# Patient Record
Sex: Male | Born: 1965
Health system: Southern US, Community
[De-identification: ages and names within clinical notes are randomized; demographics above are authoritative.]

## PROBLEM LIST (undated history)

## (undated) DIAGNOSIS — R7611 Nonspecific reaction to tuberculin skin test without active tuberculosis: Secondary | ICD-10-CM

## (undated) DIAGNOSIS — K859 Acute pancreatitis without necrosis or infection, unspecified: Secondary | ICD-10-CM

## (undated) DIAGNOSIS — K219 Gastro-esophageal reflux disease without esophagitis: Secondary | ICD-10-CM

## (undated) DIAGNOSIS — J189 Pneumonia, unspecified organism: Secondary | ICD-10-CM

## (undated) DIAGNOSIS — B3781 Candidal esophagitis: Secondary | ICD-10-CM

## (undated) HISTORY — DX: Acute pancreatitis without necrosis or infection, unspecified: K85.90

## (undated) HISTORY — DX: Gastro-esophageal reflux disease without esophagitis: K21.9

## (undated) HISTORY — PX: ESOPHAGOGASTRODUODENOSCOPY: SHX1529

## (undated) HISTORY — PX: TONSILLECTOMY: SUR1361

## (undated) HISTORY — DX: Pneumonia, unspecified organism: J18.9

## (undated) HISTORY — DX: Nonspecific reaction to tuberculin skin test without active tuberculosis: R76.11

## (undated) HISTORY — DX: Candidal esophagitis: B37.81

## (undated) HISTORY — PX: HERNIA REPAIR: SHX51

---

## 2005-09-23 ENCOUNTER — Ambulatory Visit: Payer: Self-pay | Admitting: Internal Medicine

## 2005-09-23 ENCOUNTER — Ambulatory Visit: Payer: Self-pay | Admitting: Cardiovascular Disease

## 2006-12-30 ENCOUNTER — Ambulatory Visit (HOSPITAL_COMMUNITY): Admission: RE | Admit: 2006-12-30 | Discharge: 2006-12-30 | Payer: Self-pay | Admitting: Internal Medicine

## 2008-02-22 ENCOUNTER — Ambulatory Visit (HOSPITAL_COMMUNITY): Admission: RE | Admit: 2008-02-22 | Discharge: 2008-02-22 | Payer: Self-pay | Admitting: Internal Medicine

## 2008-03-04 ENCOUNTER — Telehealth: Payer: Self-pay | Admitting: Internal Medicine

## 2008-03-12 ENCOUNTER — Encounter: Payer: Self-pay | Admitting: Internal Medicine

## 2008-03-12 ENCOUNTER — Ambulatory Visit: Payer: Self-pay | Admitting: Internal Medicine

## 2008-03-19 ENCOUNTER — Encounter: Payer: Self-pay | Admitting: Internal Medicine

## 2008-03-19 DIAGNOSIS — B3781 Candidal esophagitis: Secondary | ICD-10-CM | POA: Insufficient documentation

## 2008-03-19 DIAGNOSIS — K219 Gastro-esophageal reflux disease without esophagitis: Secondary | ICD-10-CM

## 2008-08-17 ENCOUNTER — Ambulatory Visit (HOSPITAL_COMMUNITY): Admission: RE | Admit: 2008-08-17 | Discharge: 2008-08-17 | Payer: Self-pay | Admitting: Physician Assistant

## 2008-08-18 ENCOUNTER — Telehealth: Payer: Self-pay | Admitting: Internal Medicine

## 2008-08-18 ENCOUNTER — Ambulatory Visit: Payer: Self-pay | Admitting: Infectious Diseases

## 2008-08-18 ENCOUNTER — Encounter: Payer: Self-pay | Admitting: Internal Medicine

## 2008-08-18 DIAGNOSIS — Z8709 Personal history of other diseases of the respiratory system: Secondary | ICD-10-CM | POA: Insufficient documentation

## 2008-08-18 DIAGNOSIS — Z87448 Personal history of other diseases of urinary system: Secondary | ICD-10-CM

## 2008-08-18 DIAGNOSIS — Z8719 Personal history of other diseases of the digestive system: Secondary | ICD-10-CM | POA: Insufficient documentation

## 2008-08-18 DIAGNOSIS — R21 Rash and other nonspecific skin eruption: Secondary | ICD-10-CM

## 2008-08-18 LAB — CONVERTED CEMR LAB
AST: 20 units/L (ref 0–37)
BUN: 19 mg/dL (ref 6–23)
Basophils Relative: 1 % (ref 0–1)
Calcium: 9.7 mg/dL (ref 8.4–10.5)
Chloride: 105 meq/L (ref 96–112)
Creatinine, Ser: 0.96 mg/dL (ref 0.40–1.50)
Eosinophils Absolute: 0.1 10*3/uL (ref 0.0–0.7)
Eosinophils Relative: 2 % (ref 0–5)
HCT: 38.1 % — ABNORMAL LOW (ref 39.0–52.0)
Hemoglobin: 12.9 g/dL — ABNORMAL LOW (ref 13.0–17.0)
MCHC: 33.9 g/dL (ref 30.0–36.0)
MCV: 87.8 fL (ref 78.0–100.0)
Monocytes Absolute: 0.5 10*3/uL (ref 0.1–1.0)
Monocytes Relative: 8 % (ref 3–12)
Neutro Abs: 3.9 10*3/uL (ref 1.7–7.7)
Nitrite: NEGATIVE
RBC: 4.34 M/uL (ref 4.22–5.81)
Specific Gravity, Urine: 1.038 — ABNORMAL HIGH (ref 1.005–1.03)
Total Bilirubin: 0.4 mg/dL (ref 0.3–1.2)
pH: 5.5 (ref 5.0–8.0)

## 2008-08-19 ENCOUNTER — Ambulatory Visit: Payer: Self-pay | Admitting: Internal Medicine

## 2009-02-25 ENCOUNTER — Telehealth: Payer: Self-pay | Admitting: Cardiovascular Disease

## 2009-02-25 ENCOUNTER — Ambulatory Visit: Payer: Self-pay | Admitting: Cardiovascular Disease

## 2009-06-22 ENCOUNTER — Encounter: Payer: Self-pay | Admitting: Internal Medicine

## 2009-06-23 ENCOUNTER — Telehealth: Payer: Self-pay | Admitting: Internal Medicine

## 2009-06-25 ENCOUNTER — Ambulatory Visit: Payer: Self-pay | Admitting: Internal Medicine

## 2009-08-20 ENCOUNTER — Telehealth (INDEPENDENT_AMBULATORY_CARE_PROVIDER_SITE_OTHER): Payer: Self-pay | Admitting: *Deleted

## 2009-11-04 ENCOUNTER — Ambulatory Visit: Payer: Self-pay | Admitting: Cardiovascular Disease

## 2010-08-10 NOTE — Progress Notes (Signed)
Summary: Refill Dexilant  Phone Note Call from Patient   Summary of Call: per email communication pt request refills of Dexilant and 90 day rx.  Return email sent to pt advising refills sent to [facility] in December and requesting name of mail order pharmacy.  Awaiting response from pt. Initial call taken by: Francee Piccolo CMA Duncan Dull),  August 20, 2009 10:47 AM  Follow-up for Phone Call        return email from pt..mail 90 day rx to home address.  Discount card included. Follow-up by: Francee Piccolo CMA Duncan Dull),  August 21, 2009 8:55 AM    Prescriptions: DEXILANT 60 MG CPDR (DEXLANSOPRAZOLE) 1 by mouth 30 minutes prior to the first meal of the day  #90 x 3   Entered by:   Francee Piccolo CMA (AAMA)   Authorized by:   Iva Boop MD, [facility]   Signed by:   Francee Piccolo CMA (AAMA) on 08/21/2009   Method used:   Print then Mail to Patient   RxID:   [id number]

## 2010-09-26 ENCOUNTER — Telehealth: Payer: Self-pay | Admitting: Internal Medicine

## 2010-10-07 NOTE — Progress Notes (Signed)
Summary: Rx refill through [facility]  Phone Note Other Incoming   Summary of Call: Patient emailed re: needing new Dexilant Rx with [facility] mail order    Prescriptions: DEXILANT 60 MG CPDR (DEXLANSOPRAZOLE) 1 by mouth 30 minutes prior to the first meal of the day Brand medically necessary #90 x 3   Entered and Authorized by:   Iva Boop MD, [facility]   Signed by:   Iva Boop MD, FACG on 09/26/2010   Method used:   Faxed to ...       [facility]--mail order pharmacy (mail-order)             , [place]         Ph: [phone]       Fax: [phone]   RxID:   [id number]

## 2011-01-11 ENCOUNTER — Other Ambulatory Visit: Payer: Self-pay | Admitting: Cardiovascular Disease

## 2011-01-11 ENCOUNTER — Other Ambulatory Visit: Payer: Self-pay | Admitting: *Deleted

## 2011-01-11 DIAGNOSIS — A159 Respiratory tuberculosis unspecified: Secondary | ICD-10-CM

## 2011-02-28 ENCOUNTER — Telehealth: Payer: Self-pay | Admitting: *Deleted

## 2011-02-28 ENCOUNTER — Other Ambulatory Visit (INDEPENDENT_AMBULATORY_CARE_PROVIDER_SITE_OTHER): Payer: Self-pay | Admitting: *Deleted

## 2011-02-28 DIAGNOSIS — D649 Anemia, unspecified: Secondary | ICD-10-CM

## 2011-02-28 LAB — FERRITIN: Ferritin: 42.2 ng/mL (ref 22.0–322.0)

## 2011-02-28 LAB — CBC WITH DIFFERENTIAL/PLATELET
Eosinophils Absolute: 0 10*3/uL (ref 0.0–0.7)
Eosinophils Relative: 1.1 % (ref 0.0–5.0)
Lymphocytes Relative: 35.8 % (ref 12.0–46.0)
MCHC: 33.6 g/dL (ref 30.0–36.0)
MCV: 91.3 fl (ref 78.0–100.0)
Monocytes Absolute: 0.3 10*3/uL (ref 0.1–1.0)
Neutrophils Relative %: 55.5 % (ref 43.0–77.0)
Platelets: 167 10*3/uL (ref 150.0–400.0)
WBC: 4.4 10*3/uL — ABNORMAL LOW (ref 4.5–10.5)

## 2011-02-28 LAB — IRON: Iron: 77 ug/dL (ref 42–165)

## 2011-02-28 NOTE — Telephone Encounter (Signed)
Pt needed labs

## 2011-02-28 NOTE — Telephone Encounter (Signed)
Addended by: Noralee Space on: 02/28/2011 12:22 PM   Modules accepted: Orders

## 2011-08-31 ENCOUNTER — Other Ambulatory Visit: Payer: Self-pay | Admitting: Internal Medicine

## 2013-02-24 ENCOUNTER — Telehealth: Payer: Self-pay | Admitting: Internal Medicine

## 2013-02-24 MED ORDER — DEXLANSOPRAZOLE 60 MG PO CPDR: 60.0000 mg | DELAYED_RELEASE_CAPSULE | Freq: Every day | ORAL | Status: DC

## 2013-02-24 NOTE — Telephone Encounter (Signed)
Called asking for refill of Dexilant fr GERD

## 2013-08-20 ENCOUNTER — Ambulatory Visit (INDEPENDENT_AMBULATORY_CARE_PROVIDER_SITE_OTHER): Payer: 59 | Admitting: Sports Medicine

## 2013-08-20 VITALS — BP 116/73 | Ht 73.0 in | Wt 160.0 lb

## 2013-08-20 DIAGNOSIS — G2589 Other specified extrapyramidal and movement disorders: Secondary | ICD-10-CM

## 2013-08-20 DIAGNOSIS — R279 Unspecified lack of coordination: Secondary | ICD-10-CM

## 2013-08-21 NOTE — Progress Notes (Signed)
   Subjective:    Patient ID: Trevor Gray, male    DOB: December 22, 1965, 48 y.o.   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 No injury that he can recall but a gradual onset of pain that he localizes to the peri-scapular area. It is worse with activity, improves some with rest. Pain is localized mainly along the medial border of the scapula with occasional radiating discomfort into the anterior chest. He is an avid runner and in fact is currently training for a marathon. He also does some strength training but since his shoulder started bothering him he has cut back on his resistance work. As a result his pain has improved but not resolved. He was previously having to take ibuprofen but has improved to the point that he has been able to stop that. Denies deep-seated shoulder pain.  Past medical history and current medications reviewed Allergies reviewed    Review of Systems     Objective:   Physical Exam Well-developed, fit-appearing. No acute distress. Awake alert and oriented x3. Vital signs are reviewed  Left shoulder: Full range of motion. There is tenderness to palpation along the rhomboid minor and superiormost portion of the rhomboid major muscle bellies. No soft tissue swelling. No spasm. Prominent medial border of the scapula with active forward flexion. No palpable crepitus. Rotator cuff strength is 5/5. No signs of impingement. Neurovascularly intact distally.       Assessment & Plan:  Left shoulder pain secondary to scapular dyskinesis/rhomboid strain  Patient was provided with a comprehensive scapular stabilization home exercise program. I recommended that he avoid overhead resistance training until completely asymptomatic. He can obviously continue to run. Of note, he was also complaining of a mild left hamstring strain. He localizes his pain to the mid belly of  the hamstring muscle. I gave him a home exercise program for this as well consisting of exercises from the Askling protocol as well as eccentric exercises. Followup for ongoing or recalcitrant issues.

## 2013-11-13 ENCOUNTER — Other Ambulatory Visit (HOSPITAL_COMMUNITY): Payer: Self-pay | Admitting: Adult Health

## 2013-11-13 MED ORDER — LEVOFLOXACIN 500 MG PO TABS: 500.0000 mg | ORAL_TABLET | Freq: Every day | ORAL | Status: DC

## 2014-03-12 ENCOUNTER — Telehealth: Payer: Self-pay

## 2014-03-12 NOTE — Telephone Encounter (Signed)
Received fax from pharmacy requesting refill on his dexilant Sir.  Patient has not been seen in the office in quite some time so I wanted to run it by you.   Thank you for your time.

## 2014-03-13 MED ORDER — DEXLANSOPRAZOLE 60 MG PO CPDR: 60.0000 mg | DELAYED_RELEASE_CAPSULE | Freq: Every day | ORAL | Status: DC

## 2014-03-13 NOTE — Telephone Encounter (Signed)
OK to refill 90 day supply x 1 year - Dr. Haroldine Laws and I discuss his GERD from time to time

## 2014-05-07 LAB — BASIC METABOLIC PANEL
BUN: 82 mg/dL — AB (ref 4–21)
CREATININE: 1.6 mg/dL — AB (ref 0.6–1.3)
Glucose: 226 mg/dL
Potassium: 4.4 mmol/L (ref 3.4–5.3)
Sodium: 143 mmol/L (ref 137–147)

## 2014-05-10 ENCOUNTER — Encounter: Payer: Self-pay | Admitting: Internal Medicine

## 2015-01-06 ENCOUNTER — Encounter: Payer: Self-pay | Admitting: Internal Medicine

## 2015-02-02 ENCOUNTER — Telehealth (HOSPITAL_COMMUNITY): Payer: Self-pay | Admitting: *Deleted

## 2015-02-02 ENCOUNTER — Ambulatory Visit (HOSPITAL_COMMUNITY)
Admission: RE | Admit: 2015-02-02 | Discharge: 2015-02-02 | Disposition: A | Discharge status: Home / Self Care | Payer: 59 | Source: Ambulatory Visit | Attending: Cardiology | Admitting: Cardiology

## 2015-02-02 DIAGNOSIS — I517 Cardiomegaly: Secondary | ICD-10-CM | POA: Diagnosis not present

## 2015-02-02 DIAGNOSIS — I480 Paroxysmal atrial fibrillation: Secondary | ICD-10-CM | POA: Diagnosis not present

## 2015-02-02 DIAGNOSIS — I4891 Unspecified atrial fibrillation: Secondary | ICD-10-CM | POA: Diagnosis present

## 2015-02-02 NOTE — Telephone Encounter (Signed)
Pt had episode of a-fib today, now back in NSR per Dr Aundra Dubin pt needs echo, order placed,

## 2015-05-19 ENCOUNTER — Encounter: Payer: Self-pay | Admitting: Internal Medicine

## 2015-05-19 ENCOUNTER — Other Ambulatory Visit: Payer: Self-pay | Admitting: Internal Medicine

## 2015-05-19 NOTE — Telephone Encounter (Signed)
Please advise Sir? 

## 2015-05-19 NOTE — Telephone Encounter (Signed)
Doing well on Dexilant and wants refill - he is not having dysphagia or any problems with the medication

## 2015-06-03 ENCOUNTER — Ambulatory Visit (INDEPENDENT_AMBULATORY_CARE_PROVIDER_SITE_OTHER): Payer: 59 | Admitting: Family Medicine

## 2015-06-03 ENCOUNTER — Ambulatory Visit (HOSPITAL_COMMUNITY)
Admission: RE | Admit: 2015-06-03 | Discharge: 2015-06-03 | Disposition: A | Discharge status: Home / Self Care | Payer: 59 | Source: Ambulatory Visit | Attending: Family Medicine | Admitting: Family Medicine

## 2015-06-03 ENCOUNTER — Encounter: Payer: Self-pay | Admitting: Family Medicine

## 2015-06-03 ENCOUNTER — Ambulatory Visit
Admission: RE | Admit: 2015-06-03 | Discharge: 2015-06-03 | Disposition: A | Discharge status: Home / Self Care | Payer: 59 | Source: Ambulatory Visit | Attending: Family Medicine | Admitting: Family Medicine

## 2015-06-03 VITALS — Ht 73.0 in | Wt 160.0 lb

## 2015-06-03 DIAGNOSIS — M25552 Pain in left hip: Secondary | ICD-10-CM | POA: Insufficient documentation

## 2015-06-03 NOTE — Progress Notes (Signed)
  Trevor Gray - 49 y.o. male MRN [id number]  Date of birth: [birth year]  SUBJECTIVE:  Including CC & ROS.  Trevor Gray is a 49 y.o. male who presents today for left hip pain.    Hip Pain left, initial visit - patient presents today for vague anterior left hip pain. It is worse with prolonged standing and when he has tried to run. He recently completed a 20 mile run and since that point 2 days ago has been continuing to have left anterior hip pain that has been worsening. He denies any paresthesias going down his leg but it does hurt worse when he sits on it for a prolonged period of time. No previous stress fractures. Denies any swelling or ecchymosis in the region. Does hurt with flexion of the left hip.  PMHx - Updated and reviewed.  Contributory factors include: Noncontributory PSHx - Updated and reviewed.  Contributory factors include:  Noncontributory FHx - Updated and reviewed.  Contributory factors include:  Noncontributory Medications - updated and reviewed   12 point ROS negative other than per HPI.   Exam:  Gen: NAD Cardiorespiratory - Normal respiratory effort/rate.  RRR L Hip Exam:  Pelvic alignment unremarkable to inspection and palpation. Standing hip rotation and gait without trendelenburg / unsteadiness. Greater trochanter without tenderness to palpation. No tenderness over piriformis and greater trochanter. No SI joint tenderness and normal minimal SI movement. ROM: IR: 80 Deg, ER: 80 Deg, Flexion: 120 Deg, Extension: 100 Deg, Abduction: 45 Deg, Adduction: 45 Deg Strength:  IR: 5/5, ER: 5/5, Flexion (0 and 90 degrees): 5/5, Extension: 5/5, Abduction: 5/5, Adduction: 5/5 Negative Thomas test  Negative FADIR.  Negative FADIR with axial compression Negative FAIR and Freiberg  Negative FABER in all directions, negative posterior shear, negative Gaenslen  + Hop Test (<2) and Negative Fulcrum   Neurovascularly intact B/L LE  Imaging: Ultrasound of the left hip  reveals cortical step-off of the femoral neck region with hip effusion greater than 2 mm at the anterior hip recess. The iliopsoas as muscle and tendon appear normal along with the rectus femoris direct and indirect head. There does not appear to be hypoechoic changes or hyperemia in the joint itself.

## 2015-06-03 NOTE — Assessment & Plan Note (Signed)
Concern for femoral neck stress fracture as evidence of cortical defect on ultrasound with hip effusion. -X-ray of the area and is negative we'll proceed with an MRI of the left hip. -Recommend avoidance of weightbearing exercise including underlying and lower extremity weight lifting. -We will discuss results after imaging.

## 2015-06-10 ENCOUNTER — Ambulatory Visit (HOSPITAL_COMMUNITY): Payer: 59

## 2015-06-25 ENCOUNTER — Telehealth: Payer: Self-pay | Admitting: Internal Medicine

## 2015-06-25 MED ORDER — DEXLANSOPRAZOLE 30 MG PO CPDR: 30.0000 mg | DELAYED_RELEASE_CAPSULE | Freq: Every day | ORAL | Status: DC

## 2015-06-25 NOTE — Telephone Encounter (Signed)
We discussed trying to reduce dose of PPI so will Rx Dexilant 30 mg to see if he can transition to that

## 2015-10-12 MED FILL — DEXILANT DR 30 MG CAPSULE: 30 | 60 days supply | Qty: 30 | Fill #1

## 2016-01-14 MED FILL — DEXILANT DR 60 MG CAPSULE: 60 | 90 days supply | Qty: 90 | Fill #1

## 2016-05-18 DIAGNOSIS — Q833 Accessory nipple: Secondary | ICD-10-CM | POA: Diagnosis not present

## 2016-05-18 DIAGNOSIS — Z86018 Personal history of other benign neoplasm: Secondary | ICD-10-CM | POA: Diagnosis not present

## 2016-05-18 DIAGNOSIS — B353 Tinea pedis: Secondary | ICD-10-CM | POA: Diagnosis not present

## 2016-05-18 DIAGNOSIS — D225 Melanocytic nevi of trunk: Secondary | ICD-10-CM | POA: Diagnosis not present

## 2016-05-18 DIAGNOSIS — Z23 Encounter for immunization: Secondary | ICD-10-CM | POA: Diagnosis not present

## 2016-05-18 DIAGNOSIS — L814 Other melanin hyperpigmentation: Secondary | ICD-10-CM | POA: Diagnosis not present

## 2016-05-18 DIAGNOSIS — Z85828 Personal history of other malignant neoplasm of skin: Secondary | ICD-10-CM | POA: Diagnosis not present

## 2016-05-23 ENCOUNTER — Other Ambulatory Visit: Payer: Self-pay | Admitting: Internal Medicine

## 2016-05-23 MED FILL — DEXILANT DR 60 MG CAPSULE: 60 | 90 days supply | Qty: 90 | Fill #0

## 2016-05-23 NOTE — Telephone Encounter (Signed)
I will send Dr Haroldine Laws a message with what Dr Carlean Purl recommends.

## 2016-05-23 NOTE — Telephone Encounter (Signed)
How many refills Sir, thank you. 

## 2016-05-23 NOTE — Telephone Encounter (Signed)
OK x 3 months Tell him I will need a follow-up visit or he can do his colonoscopy for screening if he is ready

## 2016-06-27 ENCOUNTER — Ambulatory Visit (HOSPITAL_COMMUNITY)
Admission: RE | Admit: 2016-06-27 | Discharge: 2016-06-27 | Disposition: A | Discharge status: Home / Self Care | Payer: 59 | Source: Ambulatory Visit | Attending: Cardiology | Admitting: Cardiology

## 2016-06-27 ENCOUNTER — Telehealth (HOSPITAL_COMMUNITY): Payer: Self-pay | Admitting: *Deleted

## 2016-06-27 DIAGNOSIS — R1010 Upper abdominal pain, unspecified: Secondary | ICD-10-CM | POA: Insufficient documentation

## 2016-06-27 LAB — COMPREHENSIVE METABOLIC PANEL
ALK PHOS: 45 U/L (ref 38–126)
ALT: 22 U/L (ref 17–63)
AST: 31 U/L (ref 15–41)
Albumin: 4.3 g/dL (ref 3.5–5.0)
Anion gap: 7 (ref 5–15)
BILIRUBIN TOTAL: 1.1 mg/dL (ref 0.3–1.2)
BUN: 18 mg/dL (ref 6–20)
CALCIUM: 9.7 mg/dL (ref 8.9–10.3)
CO2: 27 mmol/L (ref 22–32)
CREATININE: 1.03 mg/dL (ref 0.61–1.24)
Chloride: 107 mmol/L (ref 101–111)
GFR calc Af Amer: >60 mL/min (ref >60)
Glucose, Bld: 117 mg/dL — ABNORMAL HIGH (ref 65–99)
POTASSIUM: 4.3 mmol/L (ref 3.5–5.1)
Sodium: 141 mmol/L (ref 135–145)
TOTAL PROTEIN: 6.9 g/dL (ref 6.5–8.1)

## 2016-06-27 LAB — CBC
HCT: 39.5 % (ref 39.0–52.0)
Hemoglobin: 13.7 g/dL (ref 13.0–17.0)
MCH: 30.2 pg (ref 26.0–34.0)
MCHC: 34.7 g/dL (ref 30.0–36.0)
MCV: 87 fL (ref 78.0–100.0)
PLATELETS: 181 10*3/uL (ref 150–400)
RBC: 4.54 MIL/uL (ref 4.22–5.81)
RDW: 13 % (ref 11.5–15.5)
WBC: 4.8 10*3/uL (ref 4.0–10.5)

## 2016-06-27 LAB — LIPASE, BLOOD: LIPASE: 20 U/L (ref 11–51)

## 2016-06-27 NOTE — Addendum Note (Signed)
Addended by: Scarlette Calico on: 06/27/2016 10:33 AM   Modules accepted: Orders

## 2016-06-27 NOTE — Telephone Encounter (Signed)
Labs ordered per Darrick Grinder, NP

## 2016-06-28 DIAGNOSIS — H5213 Myopia, bilateral: Secondary | ICD-10-CM | POA: Diagnosis not present

## 2016-10-11 ENCOUNTER — Other Ambulatory Visit (HOSPITAL_COMMUNITY): Payer: Self-pay | Admitting: *Deleted

## 2016-10-11 MED ORDER — DEXLANSOPRAZOLE 60 MG PO CPDR: DELAYED_RELEASE_CAPSULE | ORAL | 3 refills | Status: DC

## 2016-10-17 ENCOUNTER — Telehealth (HOSPITAL_COMMUNITY): Payer: Self-pay | Admitting: Pharmacist

## 2016-10-17 NOTE — Telephone Encounter (Signed)
Dexilant 60 mg daily PA approved by [employer] through 10/13/17.   Ruta Hinds. Velva Harman, PharmD, BCPS, CPP Clinical Pharmacist Pager: [phone] Phone: [phone] 10/17/2016 11:56 AM

## 2016-10-24 MED FILL — DEXILANT DR 60 MG CAPSULE: 60 | 90 days supply | Qty: 90 | Fill #0

## 2017-03-06 MED FILL — DEXILANT DR 60 MG CAPSULE: 60 | 90 days supply | Qty: 90 | Fill #1

## 2017-06-09 MED FILL — DEXILANT DR 60 MG CAPSULE: 60 | 90 days supply | Qty: 90 | Fill #2

## 2017-07-17 ENCOUNTER — Telehealth: Payer: Self-pay | Admitting: Internal Medicine

## 2017-07-17 NOTE — Telephone Encounter (Signed)
Wants to set up EGD (GERD) and screening colonoscopy.  Please contact him to arrange direct procedures.  [phone] is his cell

## 2017-07-17 NOTE — Telephone Encounter (Signed)
Left message for patient to call back  

## 2017-07-18 ENCOUNTER — Encounter: Payer: Self-pay | Admitting: Internal Medicine

## 2017-07-18 NOTE — Telephone Encounter (Signed)
appts have been scheduled

## 2017-09-05 ENCOUNTER — Telehealth: Payer: Self-pay

## 2017-09-05 NOTE — Telephone Encounter (Signed)
No show PV at 8 am, left a voicemail at home number to please call back by 5 pm today to rs.

## 2017-09-06 ENCOUNTER — Telehealth: Payer: Self-pay | Admitting: *Deleted

## 2017-09-06 NOTE — Telephone Encounter (Signed)
Pt no showed PV yesterday at 8 am - Yesterday LM on home number and sent a staff message - no response Today no response to staff message, called home number, no answer and called cell- LM on cell number to call us to RS missed PV or let us know if we need to cancel the [facility] for 3-12 Tuesday   [facility]

## 2017-09-19 ENCOUNTER — Encounter: Payer: 59 | Admitting: Internal Medicine

## 2017-10-26 ENCOUNTER — Other Ambulatory Visit (HOSPITAL_COMMUNITY): Payer: Self-pay | Admitting: Cardiology

## 2017-10-30 ENCOUNTER — Telehealth (HOSPITAL_COMMUNITY): Payer: Self-pay | Admitting: Pharmacist

## 2017-10-30 MED FILL — DEXILANT DR 60 MG CAPSULE: 60 | 90 days supply | Qty: 90 | Fill #0

## 2017-10-30 NOTE — Telephone Encounter (Signed)
Dexilant PA approved by [employer] through 10/26/18.   Ruta Hinds. Velva Harman, PharmD, BCPS, CPP Clinical Pharmacist Phone: [phone] 10/30/2017 9:45 AM

## 2018-03-30 MED FILL — DEXILANT DR 60 MG CAPSULE: 60 | 90 days supply | Qty: 90 | Fill #1

## 2018-06-25 ENCOUNTER — Other Ambulatory Visit (HOSPITAL_COMMUNITY): Payer: Self-pay | Admitting: Pharmacist

## 2018-06-25 MED ORDER — LEVOFLOXACIN 750 MG PO TABS: 750.0000 mg | ORAL_TABLET | Freq: Every day | ORAL | 0 refills | Status: DC

## 2018-06-25 MED FILL — levoFLOXacin 750 MG TABS: 750 | 5 days supply | Qty: 5 | Fill #0

## 2018-08-07 MED FILL — DEXILANT DR 60 MG CAPSULE: 60 | 90 days supply | Qty: 90 | Fill #2

## 2018-11-08 ENCOUNTER — Other Ambulatory Visit (HOSPITAL_COMMUNITY): Payer: Self-pay | Admitting: Cardiology

## 2018-11-08 MED FILL — DEXILANT DR 60 MG CAPSULE: 60 | 90 days supply | Qty: 90 | Fill #0

## 2019-01-14 ENCOUNTER — Emergency Department (HOSPITAL_COMMUNITY)
Admission: EM | Admit: 2019-01-14 | Discharge: 2019-01-14 | Disposition: A | Discharge status: Home / Self Care | Payer: 59 | Attending: Emergency Medicine | Admitting: Emergency Medicine

## 2019-01-14 ENCOUNTER — Encounter (HOSPITAL_COMMUNITY): Payer: Self-pay | Admitting: Emergency Medicine

## 2019-01-14 ENCOUNTER — Emergency Department (HOSPITAL_COMMUNITY): Payer: 59

## 2019-01-14 ENCOUNTER — Other Ambulatory Visit: Payer: Self-pay

## 2019-01-14 DIAGNOSIS — Y9302 Activity, running: Secondary | ICD-10-CM | POA: Diagnosis not present

## 2019-01-14 DIAGNOSIS — Y999 Unspecified external cause status: Secondary | ICD-10-CM | POA: Diagnosis not present

## 2019-01-14 DIAGNOSIS — S42002A Fracture of unspecified part of left clavicle, initial encounter for closed fracture: Secondary | ICD-10-CM | POA: Diagnosis not present

## 2019-01-14 DIAGNOSIS — S42032A Displaced fracture of lateral end of left clavicle, initial encounter for closed fracture: Secondary | ICD-10-CM | POA: Diagnosis not present

## 2019-01-14 DIAGNOSIS — Y929 Unspecified place or not applicable: Secondary | ICD-10-CM | POA: Diagnosis not present

## 2019-01-14 DIAGNOSIS — W010XXA Fall on same level from slipping, tripping and stumbling without subsequent striking against object, initial encounter: Secondary | ICD-10-CM | POA: Insufficient documentation

## 2019-01-14 DIAGNOSIS — Z79899 Other long term (current) drug therapy: Secondary | ICD-10-CM | POA: Diagnosis not present

## 2019-01-14 DIAGNOSIS — S4992XA Unspecified injury of left shoulder and upper arm, initial encounter: Secondary | ICD-10-CM | POA: Diagnosis present

## 2019-01-14 MED ORDER — CYCLOBENZAPRINE HCL 10 MG PO TABS: 10.0000 mg | ORAL_TABLET | Freq: Every evening | ORAL | 0 refills | Status: DC | PRN

## 2019-01-14 NOTE — ED Triage Notes (Signed)
Patient tripped and fell on his left side while jogging this evening , reports left shoulder joint pain . No LOc/ambulatory.

## 2019-01-14 NOTE — ED Notes (Signed)
Left shoulder immobilizer applied. , PA explained results and discharge plan.

## 2019-01-14 NOTE — ED Provider Notes (Signed)
[facility] EMERGENCY DEPARTMENT Provider Note   CSN: [id number] Arrival date & time: 01/14/19  2109     History   Chief Complaint Chief Complaint  Patient presents with  . Fall    Left Shoulder Injury    HPI Trevor Gray is a 53 y.o. male presenting for evaluation of left shoulder pain.  Patient states just prior to arrival he was running when he tripped, landing on the anterior aspect of his left shoulder.  He reports acute onset pain.  Patient was able to move his arm with pain.  He denies radiation of pain.  Denies numbness or tingling in the hand.  He denies hitting his head or loss of consciousness.  No neck pain.  Patient states he is starting to have spasms of his shoulder muscles.  He denies chest pain, difficulty breathing, shortness of breath.  He is not on blood thinners.  Patient's orthopedic doctor is Dr. Ninfa Linden.     HPI  Past Medical History:  Diagnosis Date  . Esophageal candidiasis ([place])   . GERD (gastroesophageal reflux disease)   . Pancreatitis   . Pneumonia   . PPD positive     Patient Active Problem List   Diagnosis Date Noted  . Pain of left hip joint 06/03/2015  . TB SKIN TEST, POSITIVE 02/25/2009  . SKIN RASH 08/18/2008  . PNEUMONIA, HX OF 08/18/2008  . PANCREATITIS, HX OF 08/18/2008  . HEMATURIA, HX OF 08/18/2008  . GERD 03/19/2008    Past Surgical History:  Procedure Laterality Date  . ESOPHAGOGASTRODUODENOSCOPY          Home Medications    Prior to Admission medications   Medication Sig Start Date End Date Taking? Authorizing Provider  cyclobenzaprine (FLEXERIL) 10 MG tablet Take 1 tablet (10 mg total) by mouth at bedtime as needed for muscle spasms. 01/14/19   Breckon Reeves, PA-C  dexlansoprazole (DEXILANT) 60 MG capsule TAKE 1 CAPSULE BY MOUTH ONCE DAILY BEFORE BREAKFAST 11/08/18   Larey Dresser, MD  levofloxacin (LEVAQUIN) 750 MG tablet Take 1 tablet (750 mg total) by mouth daily. 06/25/18   Shirley Friar, PA-C    Family History No family history on file.  Social History Social History   Tobacco Use  . Smoking status: Never Smoker  Substance Use Topics  . Alcohol use: Not on file  . Drug use: Not on file     Allergies   Patient has no known allergies.   Review of Systems Review of Systems  Musculoskeletal: Positive for arthralgias.  Neurological: Negative for numbness.     Physical Exam Updated Vital Signs BP 114/84 (BP Location: Right Arm)   Pulse (!) 52   Temp 97.8 F (36.6 C) (Oral)   Resp 14   Ht 6\' 2"  (1.88 m)   Wt 70.3 kg   BMI 19.90 kg/m   Physical Exam Vitals signs and nursing note reviewed.  Constitutional:      General: He is not in acute distress.    Appearance: He is well-developed.     Comments: Appears nontoxic  HENT:     Head: Normocephalic and atraumatic.  Neck:     Musculoskeletal: Normal range of motion.     Comments: No tenderness palpation of the midline C-spine.  No step-offs or deformities. Pulmonary:     Effort: Pulmonary effort is normal.  Abdominal:     General: There is no distension.  Musculoskeletal: Normal range of motion.  Comments: Superficial abrasion noted over the left shoulder.  Tenderness palpation of left shoulder, AC, and distal clavicle.  No tenderness palpation of the humerus.  Radial pulse intact bilaterally. Grip strength intact.   Skin:    General: Skin is warm.     Capillary Refill: Capillary refill takes less than 2 seconds.     Findings: No rash.     Comments: Superficial abrasion of left palm and left knee.  Neurological:     Mental Status: He is alert and oriented to person, place, and time.      ED Treatments / Results  Labs (all labs ordered are listed, but only abnormal results are displayed) Labs Reviewed - No data to display  EKG None  Radiology Dg Clavicle Left  Result Date: 01/14/2019 CLINICAL DATA:  Recent fall while running, initial encounter EXAM: LEFT CLAVICLE - 2+  VIEWS COMPARISON:  None. FINDINGS: Distal clavicular fracture is noted with slight downward displacement of the distal fracture fragments. The acromioclavicular joint is intact. No other focal abnormality is noted. IMPRESSION: Distal clavicular fracture on the left with downward displacement of the distal fracture fragments. Electronically Signed   By: Inez Catalina M.D.   On: 01/14/2019 22:23   Dg Shoulder Left  Result Date: 01/14/2019 CLINICAL DATA:  Recent fall while running with shoulder pain, initial encounter EXAM: LEFT SHOULDER - 2+ VIEW COMPARISON:  None. FINDINGS: There is a distal left clavicular fracture identified with downward depression of the distal fracture fragments. The acromioclavicular joint appears intact. The humeral head is well seated. No other bony abnormality is noted. IMPRESSION: Distal left clavicular fracture as described. Electronically Signed   By: Inez Catalina M.D.   On: 01/14/2019 22:22    Procedures Procedures (including critical care time)  Medications Ordered in ED Medications - No data to display   Initial Impression / Assessment and Plan / ED Course  I have reviewed the triage vital signs and the nursing notes.  Pertinent labs & imaging results that were available during my care of the patient were reviewed by me and considered in my medical decision making (see chart for details).        Patient presenting for evaluation of left shoulder pain after fall.  Physical exam reassuring, he is neurovascularly intact.  X-rays pending.  X-rays viewed interpreted by me, shows distal clavicle fracture.  No tenting or neurovascular deficit.  Will place patient in sling and have follow-up with orthopedics.  Patient does not want narcotics for pain control, offered muscle relaxer for spasming of shoulder musculature.  Discussed treatment with Tylenol, ibuprofen, ice.  At this time, patient appears safe for discharge.  Return precautions given.  Patient states he  understands and agrees to plan.  Final Clinical Impressions(s) / ED Diagnoses   Final diagnoses:  Traumatic closed fracture of distal clavicle with minimal displacement, left, initial encounter    ED Discharge Orders         Ordered    cyclobenzaprine (FLEXERIL) 10 MG tablet  At bedtime PRN     01/14/19 2233           Franchot Heidelberg, PA-C 01/14/19 2357    Quintella Reichert, MD 01/16/19 1128

## 2019-01-14 NOTE — Discharge Instructions (Addendum)
Follow-up with Dr. Ninfa Linden as instructed.  Take ibuprofen and Tylenol as needed for pain. Use ice packs to help with pain and swelling. Use Flexeril as needed for muscle spasm.  Have caution, this may make you tired or groggy. Return to the emergency room if you develop severe worsening pain, numbness, or any new, worsening, concerning symptoms.

## 2019-01-15 DIAGNOSIS — S42032A Displaced fracture of lateral end of left clavicle, initial encounter for closed fracture: Secondary | ICD-10-CM | POA: Diagnosis not present

## 2019-02-04 ENCOUNTER — Inpatient Hospital Stay (HOSPITAL_COMMUNITY): Admission: RE | Admit: 2019-02-04 | Payer: 59 | Source: Ambulatory Visit

## 2019-02-04 ENCOUNTER — Other Ambulatory Visit (HOSPITAL_COMMUNITY): Payer: Self-pay | Admitting: Cardiology

## 2019-02-04 ENCOUNTER — Emergency Department (HOSPITAL_COMMUNITY): Admission: EM | Admit: 2019-02-04 | Discharge: 2019-02-04 | Discharge status: Absent without leave | Payer: 59

## 2019-02-04 ENCOUNTER — Inpatient Hospital Stay (HOSPITAL_COMMUNITY): Admission: RE | Admit: 2019-02-04 | Payer: 59 | Source: Home / Self Care

## 2019-02-04 LAB — SARS CORONAVIRUS 2 BY RT PCR (HOSPITAL ORDER, PERFORMED IN ~~LOC~~ HOSPITAL LAB): SARS Coronavirus 2: NEGATIVE

## 2019-03-25 ENCOUNTER — Telehealth: Payer: Self-pay | Admitting: Internal Medicine

## 2019-03-25 NOTE — Telephone Encounter (Signed)
Spoke to patient last week re: colonoscopy for screening  He wants to do in PM 10/30   Needs an EGd also - GERD  Please schedule him in the 4 and 430 slots for these procedures and arrange a previsit with him   I think previsit could be done over phone and [place] and I can drop off papers if needed   Please let me know date of previsit

## 2019-03-25 NOTE — Telephone Encounter (Signed)
Scheduled for 3:30 on 05/10/19.  I sent him a mychart message to arrange the pre-visit.

## 2019-03-28 NOTE — Telephone Encounter (Signed)
Patient has been scheduled for 04/30/19 8:00 virtual

## 2019-04-01 ENCOUNTER — Telehealth (HOSPITAL_COMMUNITY): Payer: Self-pay

## 2019-04-01 MED ORDER — DEXILANT 60 MG PO CPDR: DELAYED_RELEASE_CAPSULE | ORAL | 3 refills | Status: DC

## 2019-04-01 NOTE — Telephone Encounter (Signed)
Refilled patients Dexilant

## 2019-04-17 ENCOUNTER — Telehealth (HOSPITAL_COMMUNITY): Payer: Self-pay | Admitting: *Deleted

## 2019-04-17 NOTE — Telephone Encounter (Signed)
Dexilant was refilled for pt and needs PA, PA submitted via CMM will await approval.

## 2019-04-19 NOTE — Telephone Encounter (Signed)
Received APPROVAL for dexilant, med is approved from 04/18/2019 to 04/16/2020.

## 2019-04-22 ENCOUNTER — Other Ambulatory Visit (HOSPITAL_COMMUNITY): Payer: Self-pay

## 2019-04-22 MED ORDER — DEXILANT 60 MG PO CPDR: DELAYED_RELEASE_CAPSULE | ORAL | 3 refills | Status: DC

## 2019-04-22 MED FILL — DEXILANT DR 60 MG CAPSULE: 60 | 90 days supply | Qty: 90 | Fill #0

## 2019-05-01 ENCOUNTER — Ambulatory Visit (AMBULATORY_SURGERY_CENTER): Payer: 59 | Admitting: *Deleted

## 2019-05-01 ENCOUNTER — Encounter: Payer: Self-pay | Admitting: Internal Medicine

## 2019-05-01 ENCOUNTER — Other Ambulatory Visit: Payer: Self-pay

## 2019-05-01 VITALS — Ht 73.0 in | Wt 155.0 lb

## 2019-05-01 DIAGNOSIS — K219 Gastro-esophageal reflux disease without esophagitis: Secondary | ICD-10-CM

## 2019-05-01 DIAGNOSIS — Z1211 Encounter for screening for malignant neoplasm of colon: Secondary | ICD-10-CM

## 2019-05-01 NOTE — Progress Notes (Signed)
PATIENT DECLINED COVID TESTING!  Patient's pre-visit was done today over the phone with the patient due to COVID-19 pandemic. Name,DOB and address verified. Insurance verified. Packet of Prep instructions mailed to patient including copy of a consent form and pre-procedure patient acknowledgement form-pt is aware.   Patient denies any allergies to eggs or soy. Patient denies any problems with anesthesia/sedation. Patient denies any oxygen use at home. Patient denies taking any diet/weight loss medications or blood thinners. Patient is not being treated for MRSA or C-diff. EMMI education assisgned to patient on colonoscopy and EGD, this was explained and instructions given to patient.  Pt is aware that care partner will wait in the car during procedure; if they feel like they will be too hot or cold to wait in the car; they may wait in the 4 th floor lobby. Patient is aware to bring only one care partner. We want them to wear a mask (we do not have any that we can provide them), practice social distancing, and we will check their temperatures when they get here.  I did remind the patient that their care partner needs to stay in the parking lot the entire time and have a cell phone available, we will call them when the pt is ready for discharge. Patient will wear mask into building.

## 2019-05-10 ENCOUNTER — Other Ambulatory Visit: Payer: Self-pay

## 2019-05-10 ENCOUNTER — Ambulatory Visit (AMBULATORY_SURGERY_CENTER): Payer: 59 | Admitting: Internal Medicine

## 2019-05-10 ENCOUNTER — Encounter: Payer: Self-pay | Admitting: Internal Medicine

## 2019-05-10 ENCOUNTER — Other Ambulatory Visit: Payer: Self-pay | Admitting: Internal Medicine

## 2019-05-10 VITALS — BP 87/55 | HR 42 | Temp 97.4°F | Resp 11 | Ht 74.0 in | Wt 155.0 lb

## 2019-05-10 DIAGNOSIS — D120 Benign neoplasm of cecum: Secondary | ICD-10-CM | POA: Diagnosis not present

## 2019-05-10 DIAGNOSIS — K449 Diaphragmatic hernia without obstruction or gangrene: Secondary | ICD-10-CM

## 2019-05-10 DIAGNOSIS — D123 Benign neoplasm of transverse colon: Secondary | ICD-10-CM | POA: Diagnosis not present

## 2019-05-10 DIAGNOSIS — Z1211 Encounter for screening for malignant neoplasm of colon: Secondary | ICD-10-CM | POA: Diagnosis not present

## 2019-05-10 DIAGNOSIS — K219 Gastro-esophageal reflux disease without esophagitis: Secondary | ICD-10-CM | POA: Diagnosis not present

## 2019-05-10 MED ORDER — SODIUM CHLORIDE 0.9 % IV SOLN: 500.0000 mL | Freq: Once | INTRAVENOUS | Status: DC

## 2019-05-10 NOTE — Progress Notes (Signed)
VS by CW. Temp by LC. 

## 2019-05-10 NOTE — Op Note (Signed)
[facility] Patient Name: Trevor Gray Procedure Date: 05/10/2019 3:21 PM MRN: [id number] Endoscopist: Gatha Mayer , MD Age: 53 Referring MD:  Date of Birth: May 27, 1966 Gender: Male Account #: [id number] Procedure:                Upper GI endoscopy Indications:              Follow-up of gastro-esophageal reflux disease Medicines:                Propofol per Anesthesia, Monitored Anesthesia Care Procedure:                Pre-Anesthesia Assessment:                           - Prior to the procedure, a History and Physical                            was performed, and patient medications and                            allergies were reviewed. The patient's tolerance of                            previous anesthesia was also reviewed. The risks                            and benefits of the procedure and the sedation                            options and risks were discussed with the patient.                            All questions were answered, and informed consent                            was obtained. Prior Anticoagulants: The patient has                            taken no previous anticoagulant or antiplatelet                            agents. ASA Grade Assessment: II - A patient with                            mild systemic disease. After reviewing the risks                            and benefits, the patient was deemed in                            satisfactory condition to undergo the procedure.                           After obtaining informed consent, the endoscope was  passed under direct vision. Throughout the                            procedure, the patient's blood pressure, pulse, and                            oxygen saturations were monitored continuously. The                            Endoscope was introduced through the mouth, and                            advanced to the second part of duodenum. The upper                  GI endoscopy was accomplished without difficulty.                            The patient tolerated the procedure well. Scope In: Scope Out: Findings:                 A 1 cm sliding hiatal hernia was found.                           The exam was otherwise without abnormality.                           The cardia and gastric fundus were normal on                            retroflexion. Complications:            No immediate complications. Estimated Blood Loss:     Estimated blood loss: none. Impression:               - 1 cm sliding hiatal hernia. Hill Grade 2                           - The examination was otherwise normal.                           - No specimens collected. Recommendation:           - Patient has a contact number available for                            emergencies. The signs and symptoms of potential                            delayed complications were discussed with the                            patient. Return to normal activities tomorrow.                            Written discharge instructions were provided to the  patient.                           - Resume previous diet.                           - Continue present medications. stay on Dexilant                            Consider TIF if interested                           - See the other procedure note for documentation of                            additional recommendations. Gatha Mayer, MD 05/10/2019 3:59:26 PM This report has been signed electronically.

## 2019-05-10 NOTE — Progress Notes (Signed)
Called to room to assist during endoscopic procedure.  Patient ID and intended procedure confirmed with present staff. Received instructions for my participation in the procedure from the performing physician.  

## 2019-05-10 NOTE — Op Note (Signed)
[name] Patient Name: Trevor Gray Procedure Date: 05/10/2019 3:21 PM MRN: [id number] Endoscopist: Gatha Mayer , MD Age: 53 Referring MD:  Date of Birth: Dec 09, 1965 Gender: Male Account #: [id number] Procedure:                Colonoscopy Indications:              Screening for colorectal malignant neoplasm, This                            is the patient's first colonoscopy Medicines:                Propofol per Anesthesia, Monitored Anesthesia Care Procedure:                Pre-Anesthesia Assessment:                           - Prior to the procedure, a History and Physical                            was performed, and patient medications and                            allergies were reviewed. The patient's tolerance of                            previous anesthesia was also reviewed. The risks                            and benefits of the procedure and the sedation                            options and risks were discussed with the patient.                            All questions were answered, and informed consent                            was obtained. Prior Anticoagulants: The patient has                            taken no previous anticoagulant or antiplatelet                            agents. ASA Grade Assessment: II - A patient with                            mild systemic disease. After reviewing the risks                            and benefits, the patient was deemed in                            satisfactory condition to undergo the procedure.  After obtaining informed consent, the colonoscope                            was passed under direct vision. Throughout the                            procedure, the patient's blood pressure, pulse, and                            oxygen saturations were monitored continuously. The                            Colonoscope was introduced through the anus and                             advanced to the the cecum, identified by                            appendiceal orifice and ileocecal valve. The                            colonoscopy was performed without difficulty. The                            patient tolerated the procedure well. The quality                            of the bowel preparation was excellent. The bowel                            preparation used was Miralax via split dose                            instruction. The ileocecal valve, appendiceal                            orifice, and rectum were photographed. Scope In: 3:30:18 PM Scope Out: 3:46:00 PM Scope Withdrawal Time: 0 hours 12 minutes 45 seconds  Total Procedure Duration: 0 hours 15 minutes 42 seconds  Findings:                 The perianal and digital rectal examinations were                            normal. Pertinent negatives include normal prostate                            (size, shape, and consistency).                           Three sessile polyps were found in the transverse                            colon and cecum. The polyps were diminutive in  size. These polyps were removed with a cold snare.                            Resection and retrieval were complete. Verification                            of patient identification for the specimen was                            done. Estimated blood loss was minimal.                           Multiple diverticula were found in the sigmoid                            colon.                           The exam was otherwise without abnormality on                            direct and retroflexion views. Complications:            No immediate complications. Estimated Blood Loss:     Estimated blood loss was minimal. Impression:               - Three diminutive polyps in the transverse colon                            and in the cecum, removed with a cold snare.                            Resected and  retrieved.                           - Diverticulosis in the sigmoid colon.                           - The examination was otherwise normal on direct                            and retroflexion views. Recommendation:           - Patient has a contact number available for                            emergencies. The signs and symptoms of potential                            delayed complications were discussed with the                            patient. Return to normal activities tomorrow.                            Written discharge instructions were provided to the  patient.                           - Resume previous diet.                           - Continue present medications.                           - Repeat colonoscopy is recommended. The                            colonoscopy date will be determined after pathology                            results from today's exam become available for                            review. Gatha Mayer, MD 05/10/2019 4:01:55 PM This report has been signed electronically.

## 2019-05-10 NOTE — Patient Instructions (Addendum)
The EGD looks fine overall except tiny hiatal hernia likely. Last time there was a ring that I dilated and a 2 cm hiatal hernia.  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  Continue the Dexilant.  Trans-oral incisionless fundoplication or TIF is an option - you can Google it and check it out. Gerrit Heck does that if you are interested.  You had 3 diminutive colon polyps removed and some sigmoid diverticulosis on the colonoscopy.  Look like at least 2 adenomas I think.  I will let you know pathology results and when to have another routine colonoscopy by mail and/or My Chart.  I appreciate the opportunity to care for you. Gatha Mayer, MD, FACG  YOU HAD AN ENDOSCOPIC PROCEDURE TODAY AT [facility]:   Refer to the procedure report that was given to you for any specific questions about what was found during the examination.  If the procedure report does not answer your questions, please call your gastroenterologist to clarify.  If you requested that your care partner not be given the details of your procedure findings, then the procedure report has been included in a sealed envelope for you to review at your convenience later.  YOU SHOULD EXPECT: Some feelings of bloating in the abdomen. Passage of more gas than usual.  Walking can help get rid of the air that was put into your GI tract during the procedure and reduce the bloating. If you had a lower endoscopy (such as a colonoscopy or flexible sigmoidoscopy) you may notice spotting of blood in your stool or on the toilet paper. If you underwent a bowel prep for your procedure, you may not have a normal bowel movement for a few days.  Please Note:  You might notice some irritation and congestion in your nose or some drainage.  This is from the oxygen  used during your procedure.  There is no need for concern and it should clear up in a day or so.  SYMPTOMS TO REPORT IMMEDIATELY:   Following lower endoscopy (colonoscopy or flexible sigmoidoscopy):  Excessive amounts of blood in the stool  Significant tenderness or worsening of abdominal pains  Swelling of the abdomen that is new, acute  Fever of 100F or higher   Following upper endoscopy (EGD)  Vomiting of blood or coffee ground material  New chest pain or pain under the shoulder blades  Painful or persistently difficult swallowing  New shortness of breath  Fever of 100F or higher  Black, tarry-looking stools  For urgent or emergent issues, a gastroenterologist can be reached at any hour by calling [phone].   DIET:  We do recommend a small meal at first, but then you may proceed to your regular diet.  Drink plenty of fluids but you should avoid alcoholic beverages for 24 hours.  ACTIVITY:  You should plan to take it easy for the rest of today and you should NOT DRIVE or use heavy machinery until tomorrow (because of the sedation medicines used during the test).    FOLLOW UP: Our staff will call the number listed on your records 48-72 hours following your procedure to check on you and address any questions or concerns that you may have regarding the information given to you following your procedure. If we do not reach you, we will leave a message.  We  will attempt to reach you two times.  During this call, we will ask if you have developed any symptoms of COVID 19. If you develop any symptoms (ie: fever, flu-like symptoms, shortness of breath, cough etc.) before then, please call [phone].  If you test positive for Covid 19 in the 2 weeks post procedure, please call and report this information to [place].    If any biopsies were taken you will be contacted by phone or by letter within the next 1-3 weeks.  Please call us at [phone] if you have not heard about the  biopsies in 3 weeks.    SIGNATURES/CONFIDENTIALITY: You and/or your care partner have signed paperwork which will be entered into your electronic medical record.  These signatures attest to the fact that that the information above on your After Visit Summary has been reviewed and is understood.  Full responsibility of the confidentiality of this discharge information lies with you and/or your care-partner.

## 2019-05-10 NOTE — Progress Notes (Signed)
To PACU, VSS. Report to [email or web address] 

## 2019-05-10 NOTE — Progress Notes (Signed)
Pt's states no medical or surgical changes since previsit or office visit. 

## 2019-05-14 ENCOUNTER — Telehealth: Payer: Self-pay

## 2019-05-14 ENCOUNTER — Telehealth: Payer: Self-pay | Admitting: *Deleted

## 2019-05-14 NOTE — Telephone Encounter (Signed)
  Follow up Call-  Call back number 05/10/2019  Post procedure Call Back phone  # [phone]  Permission to leave phone message Yes  Some recent data might be hidden     Left message

## 2019-05-14 NOTE — Telephone Encounter (Signed)
Second follow up all attempt.  Message left.

## 2019-05-23 ENCOUNTER — Encounter: Payer: Self-pay | Admitting: Internal Medicine

## 2019-05-24 ENCOUNTER — Encounter: Payer: Self-pay | Admitting: Internal Medicine

## 2019-05-24 DIAGNOSIS — Z860101 Personal history of adenomatous and serrated colon polyps: Secondary | ICD-10-CM

## 2019-05-24 DIAGNOSIS — Z86010 Personal history of colonic polyps: Secondary | ICD-10-CM

## 2019-05-24 HISTORY — DX: Personal history of colonic polyps: Z86.010

## 2019-05-24 HISTORY — DX: Personal history of adenomatous and serrated colon polyps: Z86.0101

## 2019-05-24 NOTE — Progress Notes (Signed)
3 diminutive adenomas Recall 2025 My Chart

## 2019-08-14 MED FILL — DEXILANT DR 60 MG CAPSULE: 60 | 90 days supply | Qty: 90 | Fill #1

## 2019-10-25 DIAGNOSIS — H5213 Myopia, bilateral: Secondary | ICD-10-CM | POA: Diagnosis not present

## 2019-12-10 MED FILL — DEXILANT DR 60 MG CAPSULE: 60 | 90 days supply | Qty: 90 | Fill #2

## 2020-02-05 ENCOUNTER — Ambulatory Visit: Payer: 59 | Attending: Internal Medicine

## 2020-02-05 DIAGNOSIS — Z20822 Contact with and (suspected) exposure to covid-19: Secondary | ICD-10-CM

## 2020-02-06 LAB — SARS-COV-2, NAA 2 DAY TAT

## 2020-02-06 LAB — NOVEL CORONAVIRUS, NAA: SARS-CoV-2, NAA: NOT DETECTED

## 2020-04-13 ENCOUNTER — Other Ambulatory Visit: Payer: Self-pay

## 2020-04-13 ENCOUNTER — Other Ambulatory Visit (HOSPITAL_COMMUNITY): Payer: Self-pay | Admitting: Otolaryngology

## 2020-04-13 ENCOUNTER — Ambulatory Visit (HOSPITAL_COMMUNITY)
Admission: RE | Admit: 2020-04-13 | Discharge: 2020-04-13 | Disposition: A | Discharge status: Home / Self Care | Payer: 59 | Source: Ambulatory Visit | Attending: Otolaryngology | Admitting: Otolaryngology

## 2020-04-13 DIAGNOSIS — R221 Localized swelling, mass and lump, neck: Secondary | ICD-10-CM

## 2020-04-13 MED ORDER — IOHEXOL 300 MG/ML  SOLN
75.0000 mL | Freq: Once | INTRAMUSCULAR | Status: AC | PRN
  Administered 2020-04-13: 75 mL via INTRAVENOUS

## 2020-04-14 ENCOUNTER — Other Ambulatory Visit: Payer: Self-pay | Admitting: Otolaryngology

## 2020-04-14 ENCOUNTER — Other Ambulatory Visit (HOSPITAL_COMMUNITY): Payer: Self-pay | Admitting: Otolaryngology

## 2020-04-23 ENCOUNTER — Other Ambulatory Visit (HOSPITAL_COMMUNITY): Payer: Self-pay | Admitting: Cardiology

## 2020-04-23 MED FILL — DEXILANT DR 60 MG CAPSULE: 60 | 90 days supply | Qty: 90 | Fill #0

## 2020-04-26 ENCOUNTER — Emergency Department (HOSPITAL_COMMUNITY)
Admission: EM | Admit: 2020-04-26 | Discharge: 2020-04-26 | Disposition: A | Discharge status: Home / Self Care | Payer: 59 | Attending: Emergency Medicine | Admitting: Emergency Medicine

## 2020-04-26 DIAGNOSIS — Z20822 Contact with and (suspected) exposure to covid-19: Secondary | ICD-10-CM | POA: Insufficient documentation

## 2020-04-26 DIAGNOSIS — Z1152 Encounter for screening for COVID-19: Secondary | ICD-10-CM | POA: Diagnosis present

## 2020-04-26 LAB — RESPIRATORY PANEL BY RT PCR (FLU A&B, COVID)
Influenza A by PCR: NEGATIVE
Influenza B by PCR: NEGATIVE
SARS Coronavirus 2 by RT PCR: NEGATIVE

## 2020-04-26 NOTE — ED Triage Notes (Signed)
Emergency Medicine Provider Triage Evaluation Note  Trevor Gray , a 54 y.o. male  was evaluated in triage.  Pt complains of need for pcr covid test. Discussed with Dr Haroldine Laws - he is leaving for [place] for conference. Scheduled op test is running behind and he will not get results soon enough. He is here only for covid test. I have ordered test and he will be swabbed and discharged to follow up results in [place], MD 04/26/20 1916

## 2020-04-26 NOTE — ED Triage Notes (Signed)
Pt here for covid testing PCR

## 2020-08-21 MED FILL — DEXLANSOPRAZOLE 60 MG CPDR: 60 | 90 days supply | Qty: 90 | Fill #1

## 2020-11-27 IMAGING — CR LEFT SHOULDER - 2+ VIEW
2 series · 2 of 2 positions shown · non-contrast
Comparison: None.

CLINICAL DATA: Recent fall while running with shoulder pain,
initial encounter

EXAM:
LEFT SHOULDER - 2+ VIEW

[shoulder grashey]
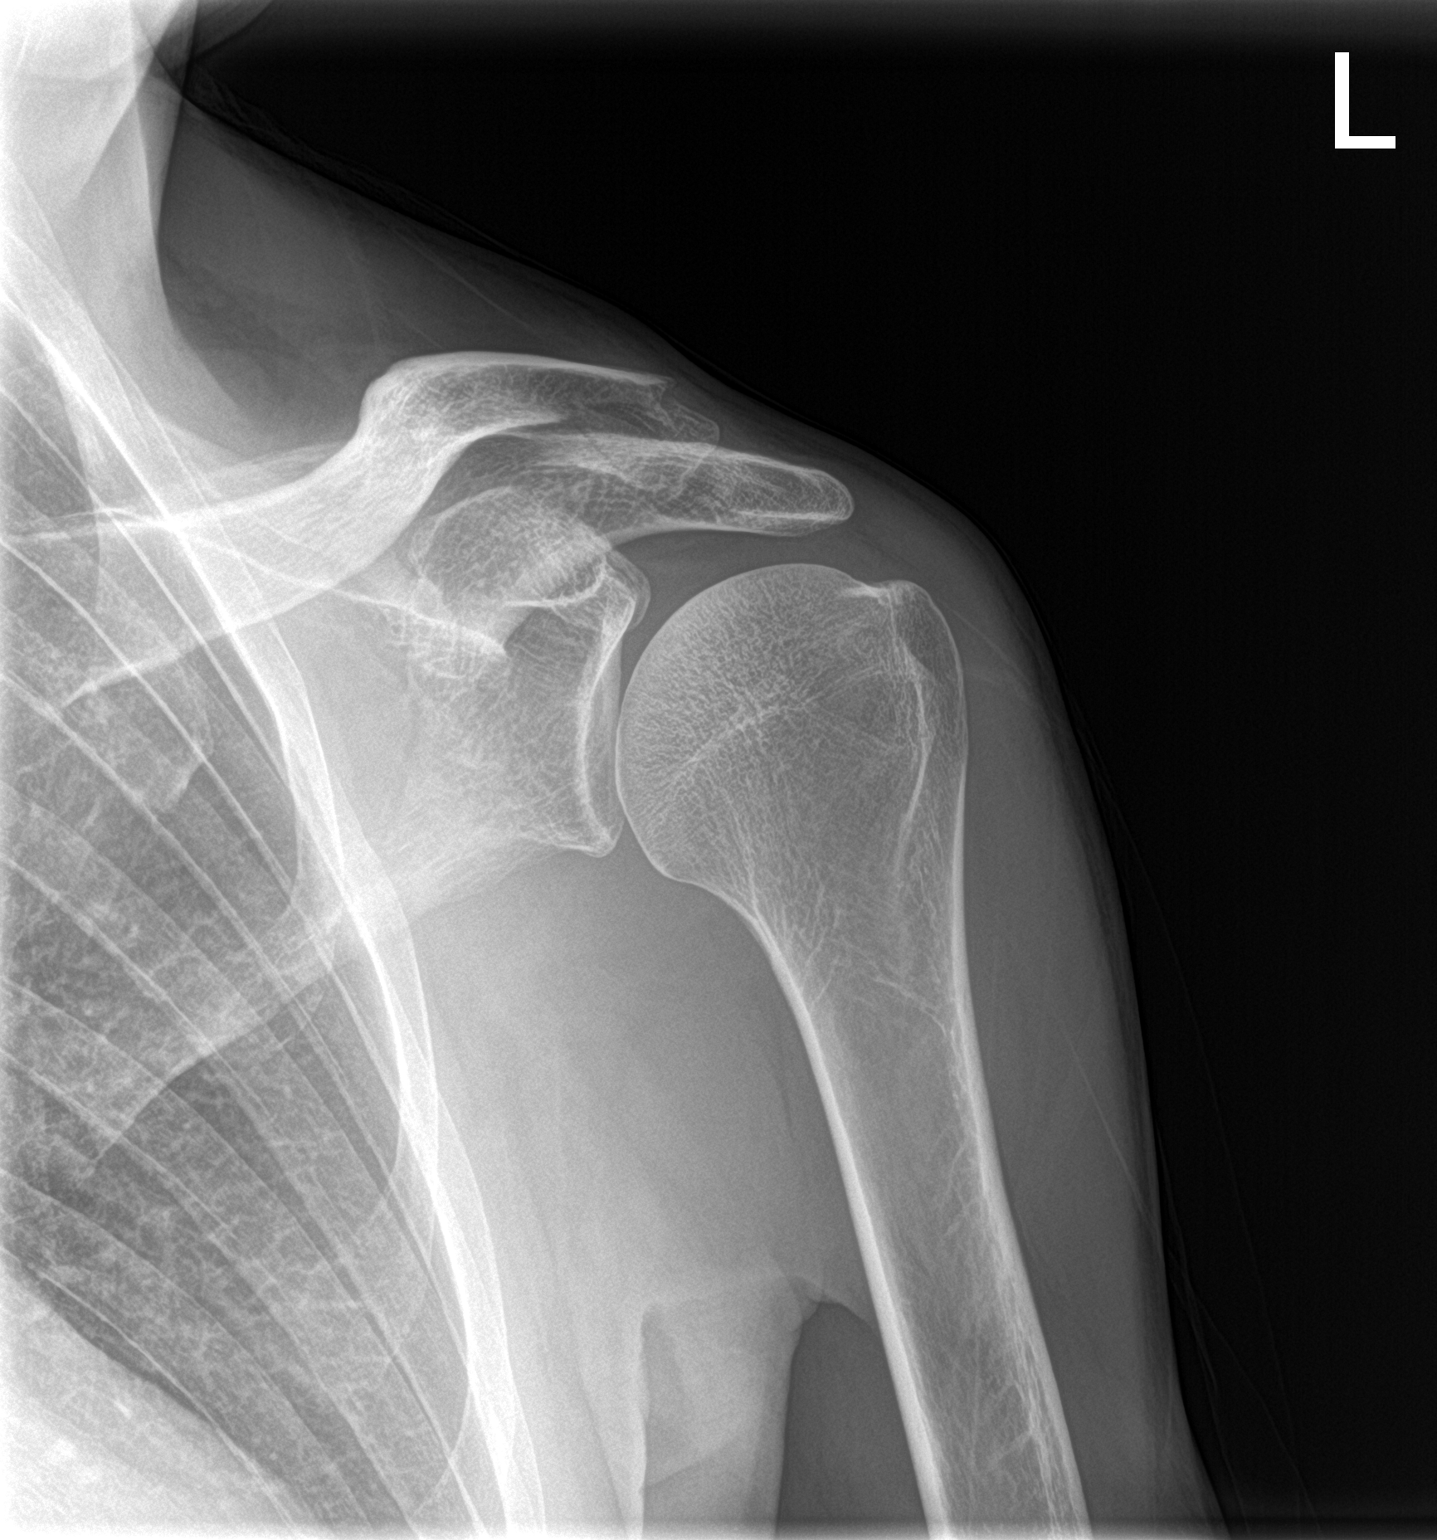

[shoulder y view]
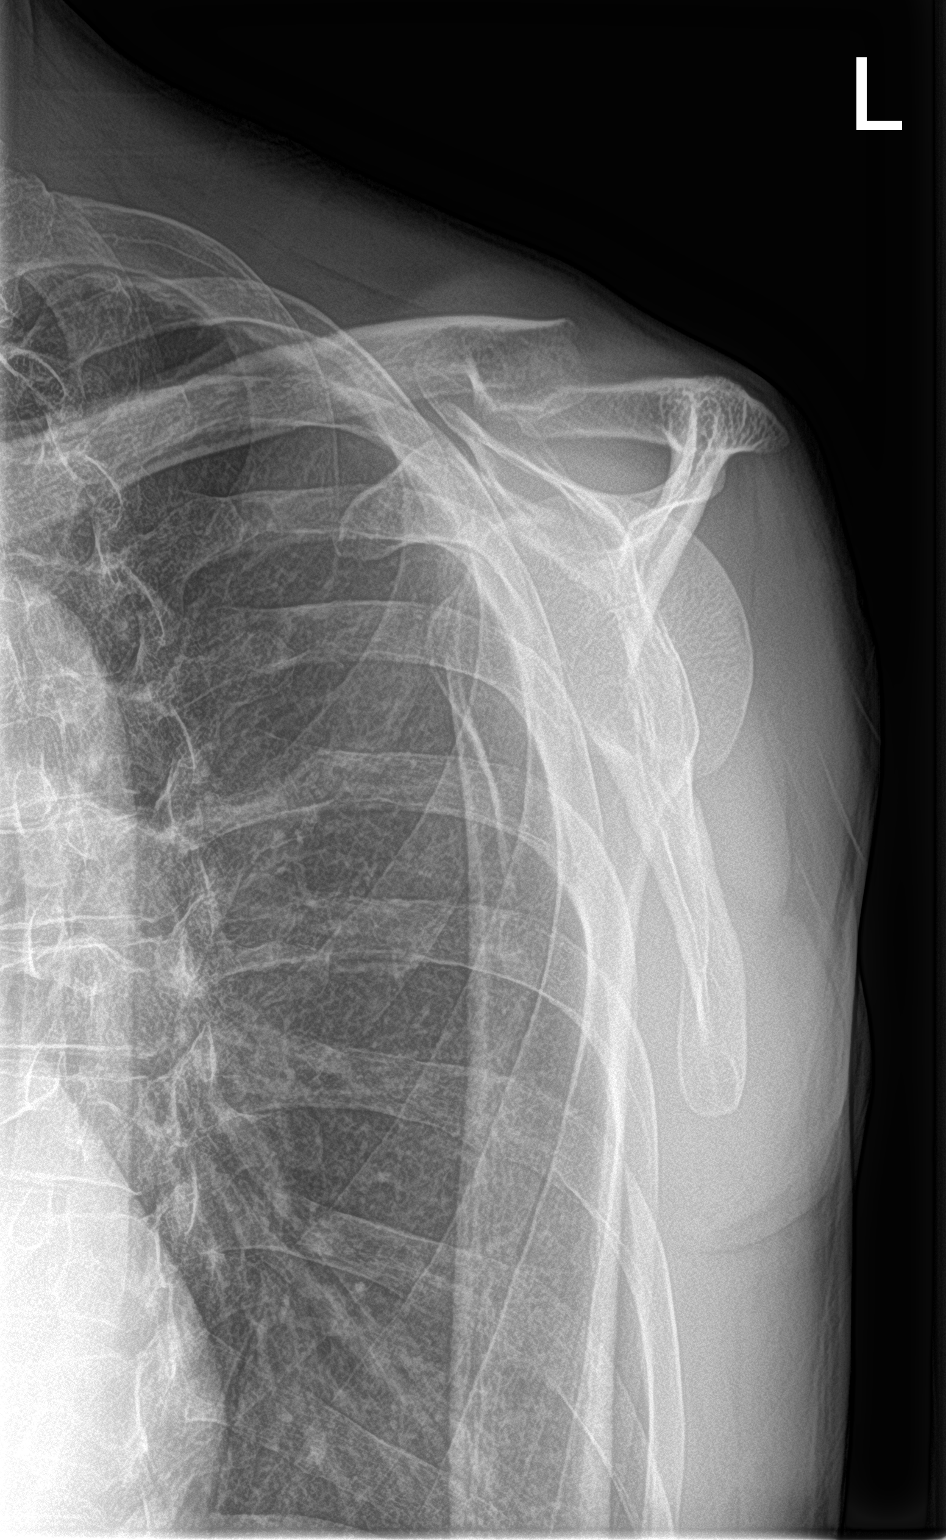

[2 of 2 positions shown; findings below may reference images not displayed]

FINDINGS: There is a distal left clavicular fracture identified with downward
depression of the distal fracture fragments. The acromioclavicular
joint appears intact. The humeral head is well seated. No other bony
abnormality is noted.
IMPRESSION: Distal left clavicular fracture as described.

## 2020-11-27 IMAGING — CR LEFT CLAVICLE - 2+ VIEWS
2 series · 2 of 2 positions shown · non-contrast
Comparison: None.

CLINICAL DATA: Recent fall while running, initial encounter

EXAM:
LEFT CLAVICLE - 2+ VIEWS

[clavicle ap]
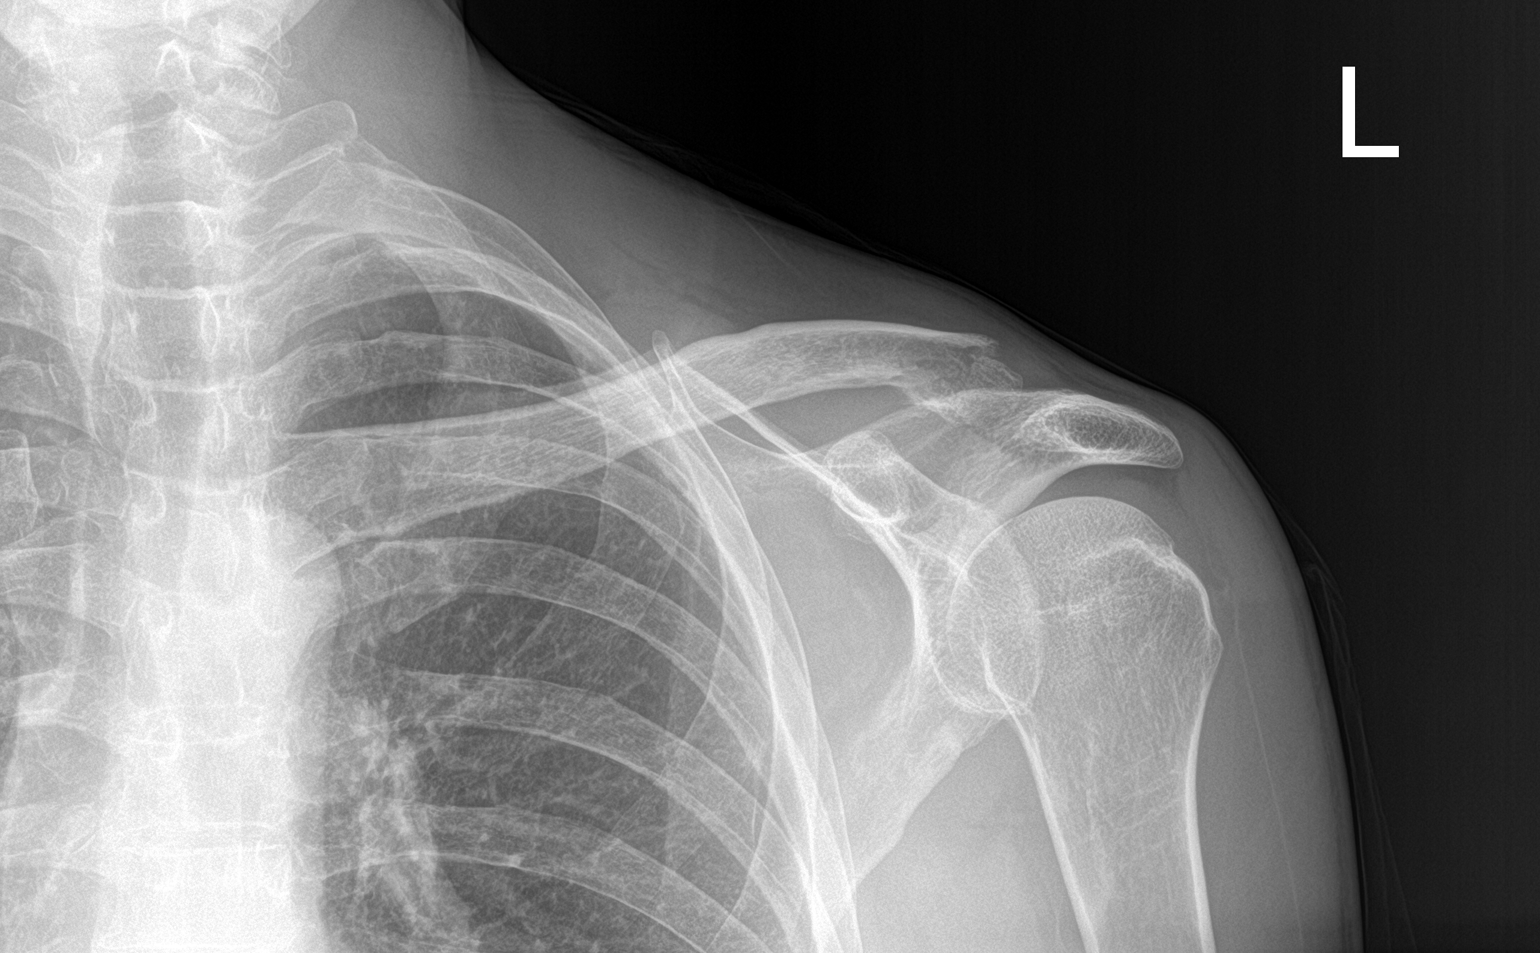

[clavicle axial]
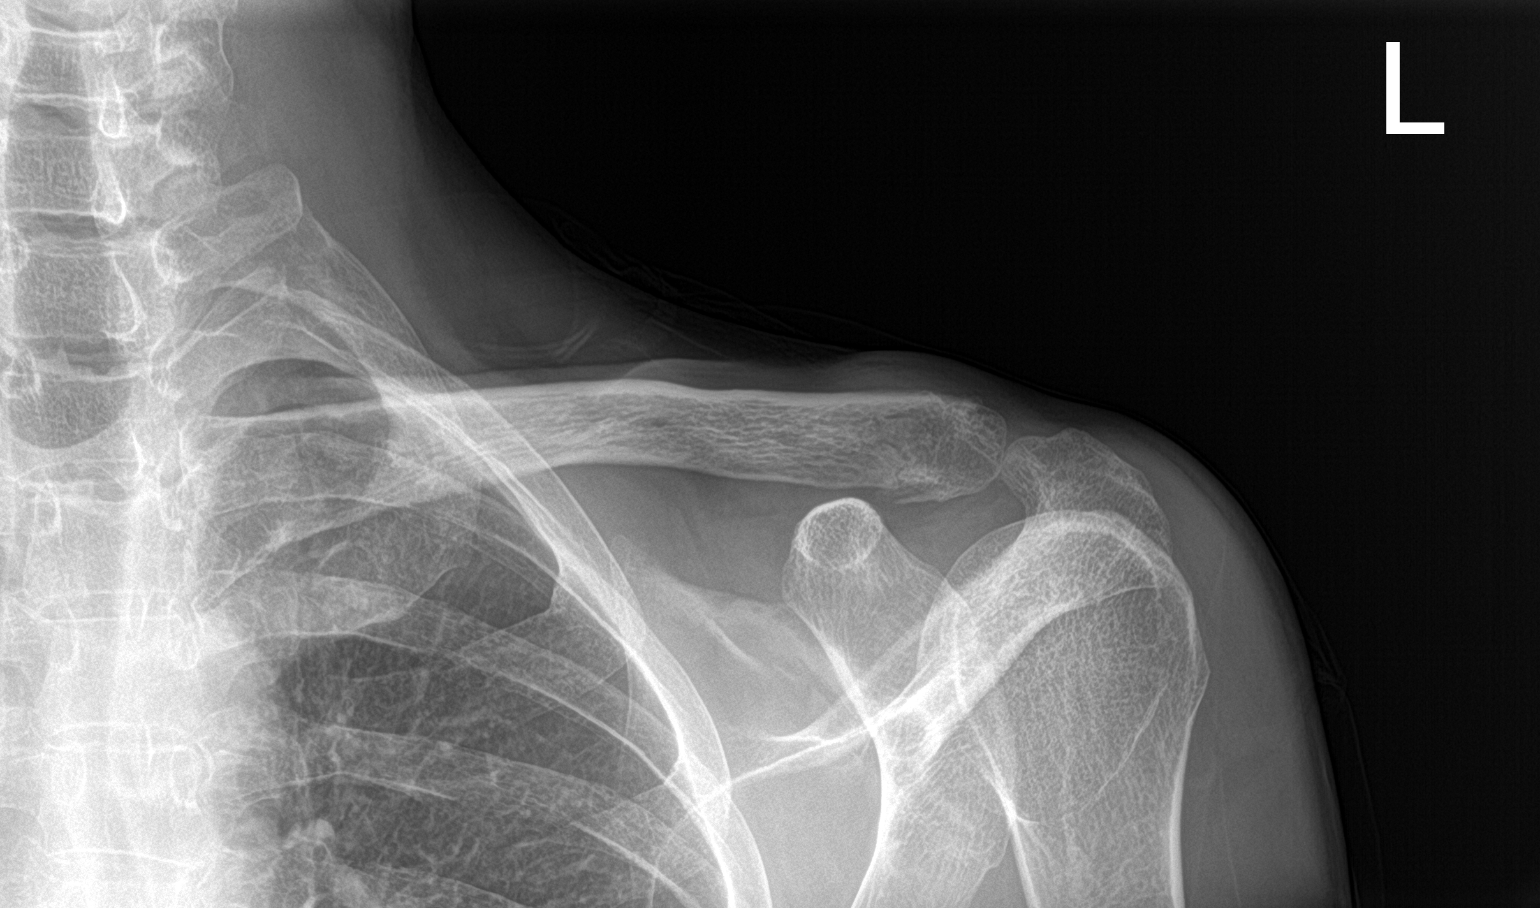

[2 of 2 positions shown; findings below may reference images not displayed]

FINDINGS: Distal clavicular fracture is noted with slight downward
displacement of the distal fracture fragments. The acromioclavicular
joint is intact. No other focal abnormality is noted.
IMPRESSION: Distal clavicular fracture on the left with downward displacement of
the distal fracture fragments.

## 2021-02-23 ENCOUNTER — Other Ambulatory Visit (HOSPITAL_COMMUNITY): Payer: Self-pay | Admitting: *Deleted

## 2021-02-23 ENCOUNTER — Other Ambulatory Visit (HOSPITAL_COMMUNITY): Payer: Self-pay

## 2021-02-23 MED ORDER — DEXLANSOPRAZOLE 60 MG PO CPDR
60.0000 mg | DELAYED_RELEASE_CAPSULE | Freq: Every day | ORAL | 3 refills | Status: DC
  Filled 2021-02-23: qty 90, 90d supply, fill #0

## 2021-06-23 ENCOUNTER — Telehealth: Payer: Self-pay | Admitting: Internal Medicine

## 2021-06-23 ENCOUNTER — Other Ambulatory Visit (HOSPITAL_COMMUNITY): Payer: Self-pay

## 2021-06-23 MED ORDER — DEXLANSOPRAZOLE 60 MG PO CPDR
60.0000 mg | DELAYED_RELEASE_CAPSULE | Freq: Every day | ORAL | 3 refills | Status: DC
  Filled 2021-06-23 – 2021-07-13 (×3): qty 90, 90d supply, fill #0
  Filled 2021-12-06: qty 90, 90d supply, fill #1
  Filled 2022-03-21: qty 90, 90d supply, fill #2
  Filled 2022-06-14: qty 90, 90d supply, fill #3

## 2021-06-23 NOTE — Telephone Encounter (Signed)
Asking for Dexilant refill

## 2021-07-13 ENCOUNTER — Other Ambulatory Visit (HOSPITAL_COMMUNITY): Payer: Self-pay

## 2021-08-03 ENCOUNTER — Other Ambulatory Visit (HOSPITAL_COMMUNITY): Payer: Self-pay

## 2021-08-03 MED ORDER — CARESTART COVID-19 HOME TEST VI KIT
PACK | 0 refills | Status: DC
  Filled 2021-08-03: qty 4, 4d supply, fill #0

## 2021-11-19 ENCOUNTER — Other Ambulatory Visit (HOSPITAL_COMMUNITY): Payer: Self-pay | Admitting: Cardiology

## 2021-11-19 ENCOUNTER — Other Ambulatory Visit (HOSPITAL_COMMUNITY): Payer: Self-pay

## 2021-11-19 DIAGNOSIS — Z1329 Encounter for screening for other suspected endocrine disorder: Secondary | ICD-10-CM

## 2021-11-19 NOTE — Progress Notes (Signed)
Thyroid screening labs placed  ?

## 2021-12-07 ENCOUNTER — Other Ambulatory Visit (HOSPITAL_COMMUNITY): Payer: Self-pay

## 2021-12-08 ENCOUNTER — Other Ambulatory Visit (HOSPITAL_COMMUNITY): Payer: Self-pay | Admitting: *Deleted

## 2021-12-08 ENCOUNTER — Ambulatory Visit (HOSPITAL_COMMUNITY): Admission: RE | Admit: 2021-12-08 | Payer: 59 | Source: Ambulatory Visit

## 2021-12-08 DIAGNOSIS — R5383 Other fatigue: Secondary | ICD-10-CM

## 2021-12-08 DIAGNOSIS — Z1329 Encounter for screening for other suspected endocrine disorder: Secondary | ICD-10-CM

## 2021-12-09 ENCOUNTER — Other Ambulatory Visit (HOSPITAL_COMMUNITY): Payer: 59

## 2021-12-09 ENCOUNTER — Other Ambulatory Visit (HOSPITAL_COMMUNITY): Payer: Self-pay

## 2021-12-29 DIAGNOSIS — R3915 Urgency of urination: Secondary | ICD-10-CM | POA: Diagnosis not present

## 2021-12-29 DIAGNOSIS — K5730 Diverticulosis of large intestine without perforation or abscess without bleeding: Secondary | ICD-10-CM | POA: Diagnosis not present

## 2021-12-29 DIAGNOSIS — R1032 Left lower quadrant pain: Secondary | ICD-10-CM | POA: Diagnosis not present

## 2021-12-29 DIAGNOSIS — N1330 Unspecified hydronephrosis: Secondary | ICD-10-CM | POA: Diagnosis not present

## 2021-12-29 DIAGNOSIS — N411 Chronic prostatitis: Secondary | ICD-10-CM | POA: Diagnosis not present

## 2021-12-29 DIAGNOSIS — Z87438 Personal history of other diseases of male genital organs: Secondary | ICD-10-CM | POA: Diagnosis not present

## 2022-03-21 ENCOUNTER — Other Ambulatory Visit (HOSPITAL_COMMUNITY): Payer: Self-pay

## 2022-04-04 ENCOUNTER — Ambulatory Visit (HOSPITAL_COMMUNITY)
Admission: RE | Admit: 2022-04-04 | Discharge: 2022-04-04 | Disposition: A | Discharge status: Home / Self Care | Payer: 59 | Source: Ambulatory Visit | Attending: Cardiology | Admitting: Cardiology

## 2022-04-04 DIAGNOSIS — Z1329 Encounter for screening for other suspected endocrine disorder: Secondary | ICD-10-CM | POA: Diagnosis not present

## 2022-04-04 DIAGNOSIS — R5383 Other fatigue: Secondary | ICD-10-CM | POA: Diagnosis not present

## 2022-04-04 LAB — COMPREHENSIVE METABOLIC PANEL
ALT: 28 U/L (ref 0–44)
AST: 32 U/L (ref 15–41)
Albumin: 4.1 g/dL (ref 3.5–5.0)
Alkaline Phosphatase: 47 U/L (ref 38–126)
Anion gap: 5 (ref 5–15)
BUN: 20 mg/dL (ref 6–20)
CO2: 30 mmol/L (ref 22–32)
Calcium: 9.5 mg/dL (ref 8.9–10.3)
Chloride: 107 mmol/L (ref 98–111)
Creatinine, Ser: 1 mg/dL (ref 0.61–1.24)
GFR, Estimated: >60 mL/min (ref >60)
Glucose, Bld: 92 mg/dL (ref 70–99)
Potassium: 3.7 mmol/L (ref 3.5–5.1)
Sodium: 142 mmol/L (ref 135–145)
Total Bilirubin: 0.9 mg/dL (ref 0.3–1.2)
Total Protein: 7.1 g/dL (ref 6.5–8.1)

## 2022-04-04 LAB — CBC
HCT: 38.1 % — ABNORMAL LOW (ref 39.0–52.0)
Hemoglobin: 12.8 g/dL — ABNORMAL LOW (ref 13.0–17.0)
MCH: 30 pg (ref 26.0–34.0)
MCHC: 33.6 g/dL (ref 30.0–36.0)
MCV: 89.2 fL (ref 80.0–100.0)
Platelets: 186 10*3/uL (ref 150–400)
RBC: 4.27 MIL/uL (ref 4.22–5.81)
RDW: 12.9 % (ref 11.5–15.5)
WBC: 5.1 10*3/uL (ref 4.0–10.5)
nRBC: 0 % (ref 0.0–0.2)

## 2022-04-04 LAB — TSH: TSH: 2.83 u[IU]/mL (ref 0.350–4.500)

## 2022-04-04 LAB — T4, FREE: Free T4: 0.79 ng/dL (ref 0.61–1.12)

## 2022-04-06 LAB — T3, FREE: T3, Free: 3 pg/mL (ref 2.0–4.4)

## 2022-06-15 ENCOUNTER — Other Ambulatory Visit (HOSPITAL_COMMUNITY): Payer: Self-pay

## 2022-07-01 ENCOUNTER — Other Ambulatory Visit (HOSPITAL_COMMUNITY): Payer: Self-pay

## 2022-09-01 DIAGNOSIS — M9903 Segmental and somatic dysfunction of lumbar region: Secondary | ICD-10-CM | POA: Diagnosis not present

## 2022-09-01 DIAGNOSIS — M9902 Segmental and somatic dysfunction of thoracic region: Secondary | ICD-10-CM | POA: Diagnosis not present

## 2022-09-01 DIAGNOSIS — M9905 Segmental and somatic dysfunction of pelvic region: Secondary | ICD-10-CM | POA: Diagnosis not present

## 2022-09-01 DIAGNOSIS — M542 Cervicalgia: Secondary | ICD-10-CM | POA: Diagnosis not present

## 2022-09-01 DIAGNOSIS — M546 Pain in thoracic spine: Secondary | ICD-10-CM | POA: Diagnosis not present

## 2022-09-01 DIAGNOSIS — M9907 Segmental and somatic dysfunction of upper extremity: Secondary | ICD-10-CM | POA: Diagnosis not present

## 2022-09-01 DIAGNOSIS — M9908 Segmental and somatic dysfunction of rib cage: Secondary | ICD-10-CM | POA: Diagnosis not present

## 2022-09-01 DIAGNOSIS — M9901 Segmental and somatic dysfunction of cervical region: Secondary | ICD-10-CM | POA: Diagnosis not present

## 2022-09-01 DIAGNOSIS — M25511 Pain in right shoulder: Secondary | ICD-10-CM | POA: Diagnosis not present

## 2022-09-05 DIAGNOSIS — M546 Pain in thoracic spine: Secondary | ICD-10-CM | POA: Diagnosis not present

## 2022-09-05 DIAGNOSIS — M9902 Segmental and somatic dysfunction of thoracic region: Secondary | ICD-10-CM | POA: Diagnosis not present

## 2022-09-05 DIAGNOSIS — M9901 Segmental and somatic dysfunction of cervical region: Secondary | ICD-10-CM | POA: Diagnosis not present

## 2022-09-05 DIAGNOSIS — M542 Cervicalgia: Secondary | ICD-10-CM | POA: Diagnosis not present

## 2022-09-05 DIAGNOSIS — M9908 Segmental and somatic dysfunction of rib cage: Secondary | ICD-10-CM | POA: Diagnosis not present

## 2022-09-10 ENCOUNTER — Other Ambulatory Visit: Payer: Self-pay | Admitting: Internal Medicine

## 2022-09-12 ENCOUNTER — Other Ambulatory Visit (HOSPITAL_COMMUNITY): Payer: Self-pay

## 2022-09-12 MED ORDER — DEXLANSOPRAZOLE 60 MG PO CPDR
60.0000 mg | DELAYED_RELEASE_CAPSULE | Freq: Every day | ORAL | 0 refills | Status: DC
  Filled 2022-09-12: qty 90, 90d supply, fill #0

## 2022-09-13 ENCOUNTER — Other Ambulatory Visit: Payer: Self-pay

## 2023-02-01 ENCOUNTER — Encounter (HOSPITAL_COMMUNITY): Payer: Self-pay | Admitting: *Deleted

## 2023-02-01 ENCOUNTER — Emergency Department (HOSPITAL_COMMUNITY): Payer: 59

## 2023-02-01 ENCOUNTER — Other Ambulatory Visit: Payer: Self-pay

## 2023-02-01 ENCOUNTER — Emergency Department (HOSPITAL_COMMUNITY)
Admission: EM | Admit: 2023-02-01 | Discharge: 2023-02-01 | Disposition: A | Discharge status: Home / Self Care | Payer: 59 | Attending: Emergency Medicine | Admitting: Emergency Medicine

## 2023-02-01 DIAGNOSIS — R220 Localized swelling, mass and lump, head: Secondary | ICD-10-CM | POA: Diagnosis not present

## 2023-02-01 DIAGNOSIS — W19XXXA Unspecified fall, initial encounter: Secondary | ICD-10-CM

## 2023-02-01 DIAGNOSIS — S0083XA Contusion of other part of head, initial encounter: Secondary | ICD-10-CM | POA: Diagnosis not present

## 2023-02-01 DIAGNOSIS — S7002XA Contusion of left hip, initial encounter: Secondary | ICD-10-CM | POA: Insufficient documentation

## 2023-02-01 DIAGNOSIS — S79912A Unspecified injury of left hip, initial encounter: Secondary | ICD-10-CM | POA: Diagnosis present

## 2023-02-01 DIAGNOSIS — W01198A Fall on same level from slipping, tripping and stumbling with subsequent striking against other object, initial encounter: Secondary | ICD-10-CM | POA: Diagnosis not present

## 2023-02-01 DIAGNOSIS — M25552 Pain in left hip: Secondary | ICD-10-CM | POA: Diagnosis not present

## 2023-02-01 DIAGNOSIS — S0990XA Unspecified injury of head, initial encounter: Secondary | ICD-10-CM | POA: Diagnosis not present

## 2023-02-01 DIAGNOSIS — R519 Headache, unspecified: Secondary | ICD-10-CM | POA: Diagnosis not present

## 2023-02-01 NOTE — ED Notes (Signed)
To X-ray

## 2023-02-01 NOTE — ED Provider Notes (Signed)
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   Fall  Hip Pain  Patient presents after fall.  Tripped while in his garage.  Hit his left forehead on his wife's car.  Then landed on left hip.  Pain with walking but still able to ambulate.  No loss conscious.  Not on blood thinners.  Does have visible hematoma.  No neck pain.  No numbness or weakness.  no blood thinners     Home Medications Prior to Admission medications   Medication Sig Start Date End Date Taking? Authorizing Provider  COVID-19 At Home Antigen Test [facility] COVID-19 HOME TEST) KIT Use as directed 08/03/21   Driscilla Grammes, [facility]  dexlansoprazole (DEXILANT) 60 MG capsule Take 1 capsule (60 mg total) by mouth daily before breakfast. 09/12/22   Iva Boop, MD      Allergies    Patient has no known allergies.    Review of Systems   Review of Systems  Physical Exam Updated Vital Signs BP 106/81 (BP Location: Right Arm)   Pulse (!) 51   Temp 98 F (36.7 C) (Oral)   Resp 16   SpO2 99%  Physical Exam HENT:     Head:     Comments: Hematoma over left eye.  Some mild upper lid involvement. Eyes:     Pupils: Pupils are equal, round, and reactive to light.  Cardiovascular:     Rate and Rhythm: Regular rhythm.  Musculoskeletal:     Cervical back: Neck supple. No tenderness.     Comments: Some tenderness to left hip laterally.  Still able to move.  No deformity.  Femur otherwise stable.  No knee tenderness.  Neurological:     Mental Status: He is alert.     ED Results / Procedures / Treatments   Labs (all labs ordered are listed, but only abnormal results are displayed) Labs Reviewed - No data to display  EKG None  Radiology CT HEAD WO CONTRAST ( )  Result Date: 02/01/2023 CLINICAL DATA:  Recent  fall with left-sided headaches, initial encounter EXAM: CT HEAD WITHOUT CONTRAST TECHNIQUE: Contiguous axial images were obtained from the base of the skull through the vertex without intravenous contrast. RADIATION DOSE REDUCTION: This exam was performed according to the departmental dose-optimization program which includes automated exposure control, adjustment of the mA and/or kV according to patient size and/or use of iterative reconstruction technique. COMPARISON:  04/13/2020 FINDINGS: Brain: No evidence of acute infarction, hemorrhage, hydrocephalus, extra-axial collection or mass lesion/mass effect. Vascular: No hyperdense vessel or unexpected calcification. Skull: Normal. Negative for fracture or focal lesion. Sinuses/Orbits: No acute finding. Other: Left supraorbital soft tissue swelling is noted consistent with the recent injury. IMPRESSION: Left supraorbital soft tissue swelling consistent with the recent injury. No acute intracranial abnormality noted. Electronically Signed   By: Alcide Clever M.D.   On: 02/01/2023 22:42   DG Hip Unilat W or Wo Pelvis 2-3 Views Left  Result Date: 02/01/2023 CLINICAL DATA:  Recent fall with left hip pain, initial encounter EXAM: DG HIP (WITH OR WITHOUT PELVIS) 3V LEFT COMPARISON:  06/03/2015 FINDINGS: There is no evidence of hip fracture or dislocation. There is no evidence of arthropathy or other focal bone abnormality. IMPRESSION: No acute abnormality  noted. Electronically Signed   By: Alcide Clever M.D.   On: 02/01/2023 22:40    Procedures Procedures    Medications Ordered in ED Medications - No data to display  ED Course/ Medical Decision Making/ A&P                             Medical Decision Making Amount and/or Complexity of Data Reviewed Radiology: ordered.  Patient hit head.  Hematoma over left eye.  Eye movements intact.  Head CT reassuring. Also left hip pain.  Negative x-ray.  No fracture.  Discharge home.  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  Will discharge home.        Final Clinical Impression(s) / ED Diagnoses Final diagnoses:  Fall, initial encounter  Contusion of left hip, initial encounter  Facial hematoma, initial encounter  Minor head injury, initial encounter    Rx / DC Orders ED Discharge Orders     None         Benjiman Core, MD 02/01/23 2324

## 2023-02-01 NOTE — ED Triage Notes (Signed)
Pt here for a fall; c/o L hip pain and L head pain. Hematoma noted to L head. Denies LOC, no thinners

## 2023-02-01 NOTE — ED Notes (Signed)
Assumed care of patient. Patient is alert and oriented x4. Patients wife is at bedside.

## 2024-03-10 ENCOUNTER — Emergency Department (HOSPITAL_COMMUNITY)

## 2024-03-10 ENCOUNTER — Other Ambulatory Visit: Payer: Self-pay

## 2024-03-10 ENCOUNTER — Emergency Department (HOSPITAL_COMMUNITY)
Admission: EM | Admit: 2024-03-10 | Discharge: 2024-03-10 | Disposition: A | Discharge status: Home / Self Care | Attending: Emergency Medicine | Admitting: Emergency Medicine

## 2024-03-10 ENCOUNTER — Encounter (HOSPITAL_COMMUNITY): Payer: Self-pay | Admitting: Radiology

## 2024-03-10 DIAGNOSIS — Y9302 Activity, running: Secondary | ICD-10-CM | POA: Insufficient documentation

## 2024-03-10 DIAGNOSIS — S52501A Unspecified fracture of the lower end of right radius, initial encounter for closed fracture: Secondary | ICD-10-CM | POA: Insufficient documentation

## 2024-03-10 DIAGNOSIS — W010XXA Fall on same level from slipping, tripping and stumbling without subsequent striking against object, initial encounter: Secondary | ICD-10-CM | POA: Insufficient documentation

## 2024-03-10 DIAGNOSIS — M7989 Other specified soft tissue disorders: Secondary | ICD-10-CM | POA: Diagnosis not present

## 2024-03-10 DIAGNOSIS — S52323A Displaced transverse fracture of shaft of unspecified radius, initial encounter for closed fracture: Secondary | ICD-10-CM | POA: Diagnosis not present

## 2024-03-10 DIAGNOSIS — S6991XA Unspecified injury of right wrist, hand and finger(s), initial encounter: Secondary | ICD-10-CM | POA: Diagnosis present

## 2024-03-10 MED ORDER — IBUPROFEN 800 MG PO TABS
800.0000 mg | ORAL_TABLET | Freq: Once | ORAL | Status: AC
  Administered 2024-03-10: 800 mg via ORAL
  Filled 2024-03-10: qty 1

## 2024-03-10 MED ORDER — TRAMADOL HCL 50 MG PO TABS: 50.0000 mg | ORAL_TABLET | Freq: Four times a day (QID) | ORAL | 0 refills | Status: AC | PRN

## 2024-03-10 NOTE — Progress Notes (Signed)
 Patient ID: Trevor JONELLE Fuel, MD, male   DOB: [birth year], 58 y.o.   MRN: [id number] Dr. Fuel asked me to take a look at his wrist x-ray since I know him well and have treated a clavicle fracture of his remotely in the past.  I do think he needs a splint today, and given the angulation of his fracture on the lateral view.  He will need close follow-up with one of the fellowship trained hand ortho specialists in town.  I have texted this to him as well.

## 2024-03-10 NOTE — ED Provider Notes (Signed)
 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   Pt reports he fell while running and injured his right wrist.  Pt complains of swelling and pain.  Pt reports scrapes on legs. No other injuries. No impact of head   The history is provided by the patient. No language interpreter was used.  Wrist Pain This is a new problem. The current episode started less than 1 hour ago. The problem occurs constantly. The problem has not changed since onset.Nothing aggravates the symptoms. Nothing relieves the symptoms. He has tried nothing for the symptoms. The treatment provided no relief.       Prior to Admission medications   Medication Sig Start Date End Date Taking? Authorizing Provider  traMADol  (ULTRAM ) 50 MG tablet Take 1 tablet (50 mg total) by mouth every 6 (six) hours as needed. 03/10/24 03/10/25 Yes Flint Sonny POUR, PA-C  COVID-19 At Home Antigen Test [facility] COVID-19 HOME TEST) KIT Use as directed 08/03/21   Deane Garnette JONELLE, RPH  dexlansoprazole  (DEXILANT ) 60 MG capsule Take 1 capsule (60 mg total) by mouth daily before breakfast. 09/12/22   Avram Lupita BRAVO, MD    Allergies: Patient has no known allergies.    Review of Systems  All other systems reviewed and are negative.   Updated Vital Signs BP (!) 120/95   Pulse (!) 51   Temp 97.7 F (36.5 C) (Oral)   Resp 16   Ht 6' 1 (1.854 m)   Wt 70.3 kg   SpO2 100%   BMI 20.45 kg/m   Physical Exam Vitals and nursing note reviewed.  Constitutional:      Appearance: He is well-developed.  HENT:     Head: Normocephalic.  Pulmonary:     Effort: Pulmonary effort is normal.  Abdominal:     General: There is no distension.  Musculoskeletal:        General: Swelling, tenderness and deformity present.     Comments: Nv and ns intact   Skin:    General: Skin is warm.   Neurological:     General: No focal deficit present.     Mental Status: He is alert and oriented to person, place, and time.     (all labs ordered are listed, but only abnormal results are displayed) Labs Reviewed - No data to display  EKG: None  Radiology: DG Wrist Complete Right Result Date: 03/10/2024 EXAM: 3 VIEW(S) XRAY OF THE WRIST 03/10/2024 05:06:00 PM COMPARISON: None available. CLINICAL HISTORY: Deformity. Running on a trail and tripped and fell forward. FINDINGS: BONES AND JOINTS: Acute transverse fracture of distal radial metaphysis with 50 degrees dorsal angulation at fracture site. No significant displacement is present. SOFT TISSUES: Mild soft tissue swelling. IMPRESSION: 1. Acute transverse fracture of distal radial metaphysis with 50 degrees dorsal angulation at fracture site. No significant displacement. 2. Mild soft tissue swelling. Electronically signed by: Lonni Necessary MD 03/10/2024 05:21 PM EDT RP Workstation: [id number]     Procedures   Medications Ordered in the ED  ibuprofen  (ADVIL ) tablet 800 mg (800 mg Oral Given 03/10/24 1701)                                    Medical Decision Making Pt complains  of injuring his right wrist.  Pt fell while running   Amount and/or Complexity of Data Reviewed Radiology: ordered and independent interpretation performed. Decision-making details documented in ED Course.    Details: Wrist xray shows acute transverse fracture of distal radius metaphysis with 50 degrees of dorsal angulation  Discussion of management or test interpretation with external provider(s): Pt discussed fracture with Dr. Camella.  He advised splint and follow up in office on Tuesday.  Pt scheduled for surgery on Wednesday with Dr. Shari  Risk Prescription drug management. Risk Details: RX for tramadol           Final diagnoses:  Closed fracture of distal end of right radius, unspecified fracture morphology, initial encounter    ED  Discharge Orders          Ordered    traMADol  (ULTRAM ) 50 MG tablet  Every 6 hours PRN        03/10/24 1751            An After Visit Summary was printed and given to the patient.    Brittany Amirault K, PA-C 03/10/24 1956    Zackowski, Scott, MD 03/10/24 2126

## 2024-03-10 NOTE — Discharge Instructions (Addendum)
 Return if any problems.

## 2024-03-10 NOTE — ED Triage Notes (Signed)
 Pt was running on a trail and tripped and fell forward. He states as soon as he fell and caught himself it snapped. Pt with obvious deformity to the right wrist.

## 2024-03-10 NOTE — Progress Notes (Signed)
 Orthopedic Tech Progress Note Patient Details:  DANNEL RAFTER, MD 1965/11/02 [id number]  Well-padded plaster sugartong splint placed to the RUE, followed by a sling. Motion and sensation of his digits remain intact. Ice and elevation were encouraged to help with swelling/pain.   Ortho Devices Type of Ortho Device: Sugartong splint, Arm sling, Stockinette, Cotton web roll, Ace wrap Ortho Device/Splint Location: RUE Ortho Device/Splint Interventions: Ordered, Application, Adjustment   Post Interventions Patient Tolerated: Well Instructions Provided: Care of device, Adjustment of device  Agam Davenport Ronal Brasil 03/10/2024, 7:33 PM

## 2024-03-12 DIAGNOSIS — S62101D Fracture of unspecified carpal bone, right wrist, subsequent encounter for fracture with routine healing: Secondary | ICD-10-CM | POA: Diagnosis not present

## 2024-03-13 DIAGNOSIS — Y999 Unspecified external cause status: Secondary | ICD-10-CM | POA: Diagnosis not present

## 2024-03-13 DIAGNOSIS — X58XXXA Exposure to other specified factors, initial encounter: Secondary | ICD-10-CM | POA: Diagnosis not present

## 2024-03-13 DIAGNOSIS — S52551A Other extraarticular fracture of lower end of right radius, initial encounter for closed fracture: Secondary | ICD-10-CM | POA: Diagnosis not present

## 2024-03-26 DIAGNOSIS — S62101D Fracture of unspecified carpal bone, right wrist, subsequent encounter for fracture with routine healing: Secondary | ICD-10-CM | POA: Diagnosis not present

## 2024-06-02 ENCOUNTER — Telehealth: Payer: Self-pay | Admitting: Internal Medicine

## 2024-06-02 DIAGNOSIS — Z860101 Personal history of adenomatous and serrated colon polyps: Secondary | ICD-10-CM

## 2024-06-02 DIAGNOSIS — K219 Gastro-esophageal reflux disease without esophagitis: Secondary | ICD-10-CM

## 2024-06-02 DIAGNOSIS — R1319 Other dysphagia: Secondary | ICD-10-CM

## 2024-06-02 NOTE — Telephone Encounter (Signed)
 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  He also has a hx colon polys  He needs:  1) EGD in [facility] 2) colonoscopy same day in [facility]  Encounter Diagnoses  Name Primary?   Esophageal dysphagia Yes   Gastroesophageal reflux disease, unspecified whether esophagitis present    Hx of adenomatous colonic polyps    Please contact him and arrange the appointments (previsit and procedures)

## 2024-06-05 NOTE — Telephone Encounter (Signed)
 I spoke with Dr Cherrie and set up pre-visit for 06/12/2024 and the [facility] for 06/18/2024.

## 2024-06-12 ENCOUNTER — Other Ambulatory Visit (HOSPITAL_COMMUNITY): Payer: Self-pay

## 2024-06-12 ENCOUNTER — Encounter: Payer: Self-pay | Admitting: Internal Medicine

## 2024-06-12 ENCOUNTER — Ambulatory Visit

## 2024-06-12 ENCOUNTER — Telehealth: Payer: Self-pay

## 2024-06-12 VITALS — Ht 73.0 in | Wt 150.0 lb

## 2024-06-12 DIAGNOSIS — K219 Gastro-esophageal reflux disease without esophagitis: Secondary | ICD-10-CM

## 2024-06-12 DIAGNOSIS — Z860101 Personal history of adenomatous and serrated colon polyps: Secondary | ICD-10-CM

## 2024-06-12 DIAGNOSIS — R1319 Other dysphagia: Secondary | ICD-10-CM

## 2024-06-12 MED ORDER — NA SULFATE-K SULFATE-MG SULF 17.5-3.13-1.6 GM/177ML PO SOLN
1.0000 | Freq: Once | ORAL | 0 refills | Status: DC
  Filled 2024-06-12: qty 354, 2d supply, fill #0

## 2024-06-12 NOTE — Telephone Encounter (Signed)
 I ordered the labs and will let him know he can do them

## 2024-06-12 NOTE — Telephone Encounter (Signed)
 During Pre visit patient request that the nurse contact Dr.Gessner to request the following lab:  CBC, MET & HEM A1C prior to colonoscopy on 06/18/24.

## 2024-06-12 NOTE — Progress Notes (Signed)

## 2024-06-13 ENCOUNTER — Other Ambulatory Visit: Payer: Self-pay | Admitting: Internal Medicine

## 2024-06-13 ENCOUNTER — Other Ambulatory Visit (HOSPITAL_COMMUNITY): Payer: Self-pay

## 2024-06-13 ENCOUNTER — Other Ambulatory Visit (HOSPITAL_COMMUNITY)
Admission: RE | Admit: 2024-06-13 | Discharge: 2024-06-13 | Disposition: A | Discharge status: Home / Self Care | Attending: Internal Medicine | Admitting: Internal Medicine

## 2024-06-13 ENCOUNTER — Other Ambulatory Visit: Payer: Self-pay

## 2024-06-13 DIAGNOSIS — R748 Abnormal levels of other serum enzymes: Secondary | ICD-10-CM

## 2024-06-13 DIAGNOSIS — Z8719 Personal history of other diseases of the digestive system: Secondary | ICD-10-CM

## 2024-06-13 DIAGNOSIS — K219 Gastro-esophageal reflux disease without esophagitis: Secondary | ICD-10-CM | POA: Diagnosis not present

## 2024-06-13 LAB — LIPID PANEL
Cholesterol: 200 mg/dL (ref 0–200)
HDL: 85 mg/dL (ref >40)
LDL Cholesterol: 105 mg/dL — ABNORMAL HIGH (ref 0–99)
Total CHOL/HDL Ratio: 2.4 ratio
Triglycerides: 51 mg/dL (ref <150)
VLDL: 10 mg/dL (ref 0–40)

## 2024-06-13 LAB — CBC
HCT: 39.3 % (ref 39.0–52.0)
Hemoglobin: 13.1 g/dL (ref 13.0–17.0)
MCH: 30.6 pg (ref 26.0–34.0)
MCHC: 33.3 g/dL (ref 30.0–36.0)
MCV: 91.8 fL (ref 80.0–100.0)
Platelets: 181 K/uL (ref 150–400)
RBC: 4.28 MIL/uL (ref 4.22–5.81)
RDW: 13.2 % (ref 11.5–15.5)
WBC: 3.9 K/uL — ABNORMAL LOW (ref 4.0–10.5)
nRBC: 0 % (ref 0.0–0.2)

## 2024-06-13 LAB — COMPREHENSIVE METABOLIC PANEL WITH GFR
ALT: 60 U/L — ABNORMAL HIGH (ref 0–44)
AST: 59 U/L — ABNORMAL HIGH (ref 15–41)
Albumin: 3.8 g/dL (ref 3.5–5.0)
Alkaline Phosphatase: 75 U/L (ref 38–126)
Anion gap: 9 (ref 5–15)
BUN: 30 mg/dL — ABNORMAL HIGH (ref 6–20)
CO2: 28 mmol/L (ref 22–32)
Calcium: 9.1 mg/dL (ref 8.9–10.3)
Chloride: 103 mmol/L (ref 98–111)
Creatinine, Ser: 1.09 mg/dL (ref 0.61–1.24)
GFR, Estimated: >60 mL/min (ref >60)
Glucose, Bld: 98 mg/dL (ref 70–99)
Potassium: 4.5 mmol/L (ref 3.5–5.1)
Sodium: 140 mmol/L (ref 135–145)
Total Bilirubin: 1.1 mg/dL (ref 0.0–1.2)
Total Protein: 6.7 g/dL (ref 6.5–8.1)

## 2024-06-13 LAB — CK: Total CK: 273 U/L (ref 49–397)

## 2024-06-13 LAB — HEMOGLOBIN A1C
Hgb A1c MFr Bld: 5.8 % — ABNORMAL HIGH (ref 4.8–5.6)
Mean Plasma Glucose: 120 mg/dL

## 2024-06-18 ENCOUNTER — Ambulatory Visit: Admitting: Internal Medicine

## 2024-06-18 ENCOUNTER — Encounter: Payer: Self-pay | Admitting: Internal Medicine

## 2024-06-18 VITALS — BP 81/51 | HR 42 | Temp 97.4°F | Resp 21 | Ht 73.0 in | Wt 150.0 lb

## 2024-06-18 DIAGNOSIS — R748 Abnormal levels of other serum enzymes: Secondary | ICD-10-CM

## 2024-06-18 DIAGNOSIS — D125 Benign neoplasm of sigmoid colon: Secondary | ICD-10-CM

## 2024-06-18 DIAGNOSIS — K449 Diaphragmatic hernia without obstruction or gangrene: Secondary | ICD-10-CM | POA: Diagnosis not present

## 2024-06-18 DIAGNOSIS — D122 Benign neoplasm of ascending colon: Secondary | ICD-10-CM | POA: Diagnosis not present

## 2024-06-18 DIAGNOSIS — D124 Benign neoplasm of descending colon: Secondary | ICD-10-CM | POA: Diagnosis not present

## 2024-06-18 DIAGNOSIS — Z860101 Personal history of adenomatous and serrated colon polyps: Secondary | ICD-10-CM

## 2024-06-18 DIAGNOSIS — K635 Polyp of colon: Secondary | ICD-10-CM | POA: Diagnosis not present

## 2024-06-18 DIAGNOSIS — K5730 Diverticulosis of large intestine without perforation or abscess without bleeding: Secondary | ICD-10-CM | POA: Diagnosis not present

## 2024-06-18 DIAGNOSIS — K219 Gastro-esophageal reflux disease without esophagitis: Secondary | ICD-10-CM

## 2024-06-18 DIAGNOSIS — R1319 Other dysphagia: Secondary | ICD-10-CM

## 2024-06-18 DIAGNOSIS — Z1211 Encounter for screening for malignant neoplasm of colon: Secondary | ICD-10-CM | POA: Diagnosis not present

## 2024-06-18 DIAGNOSIS — R1310 Dysphagia, unspecified: Secondary | ICD-10-CM | POA: Diagnosis not present

## 2024-06-18 MED ORDER — SODIUM CHLORIDE 0.9 % IV SOLN: 500.0000 mL | INTRAVENOUS | Status: DC

## 2024-06-18 NOTE — Progress Notes (Deleted)
 Transferred to PACU via stretcher.  Not responding to stimulation at this time.  VSS upon leaving procedure room.

## 2024-06-18 NOTE — Progress Notes (Signed)
 Called to room to assist during endoscopic procedure.  Patient ID and intended procedure confirmed with present staff. Received instructions for my participation in the procedure from the performing physician.

## 2024-06-18 NOTE — Patient Instructions (Addendum)
 EGD revealed a 1 cm hiatal hernia.  I did not see any stricture or any esophagitis.  I did do a dilation, 54 French Maloney, in the esophagus.  Colonoscopy revealed 3 polyps.  Also showed diverticulosis.  I will let you know what the pathology was and when to repeat a colonoscopy.  I suggest repeating LFTs in early January.  I entered the orders.  I appreciate the opportunity to care for you. Lupita CHARLENA Commander, MD, [facility]   Discharge instructions given. Handouts on polyps,Diverticulosis,Hiatal Hernia and a Dilatation diet. Resume previous medications. YOU HAD AN ENDOSCOPIC PROCEDURE TODAY AT THE [facility]:   Refer to the procedure report that was given to you for any specific questions about what was found during the examination.  If the procedure report does not answer your questions, please call your gastroenterologist to clarify.  If you requested that your care partner not be given the details of your procedure findings, then the procedure report has been included in a sealed envelope for you to review at your convenience later.  YOU SHOULD EXPECT: Some feelings of bloating in the abdomen. Passage of more gas than usual.  Walking can help get rid of the air that was put into your GI tract during the procedure and reduce the bloating. If you had a lower endoscopy (such as a colonoscopy or flexible sigmoidoscopy) you may notice spotting of blood in your stool or on the toilet paper. If you underwent a bowel prep for your procedure, you may not have a normal bowel movement for a few days.  Please Note:  You might notice some irritation and congestion in your nose or some drainage.  This is from the oxygen used during your procedure.  There is no need for concern and it should clear up in a day or so.  SYMPTOMS TO REPORT IMMEDIATELY:  Following lower endoscopy (colonoscopy or flexible sigmoidoscopy):  Excessive amounts of blood in the stool  Significant tenderness or worsening of  abdominal pains  Swelling of the abdomen that is new, acute  Fever of 100F or higher  Following upper endoscopy (EGD)  Vomiting of blood or coffee ground material  New chest pain or pain under the shoulder blades  Painful or persistently difficult swallowing  New shortness of breath  Fever of 100F or higher  Black, tarry-looking stools  For urgent or emergent issues, a gastroenterologist can be reached at any hour by calling [phone]. Do not use MyChart messaging for urgent concerns.    DIET:  We do recommend a small meal at first, but then you may proceed to your regular diet.  Drink plenty of fluids but you should avoid alcoholic beverages for 24 hours.  ACTIVITY:  You should plan to take it easy for the rest of today and you should NOT DRIVE or use heavy machinery until tomorrow (because of the sedation medicines used during the test).    FOLLOW UP: Our staff will call the number listed on your records the next business day following your procedure.  We will call around 7:15- 8:00 am to check on you and address any questions or concerns that you may have regarding the information given to you following your procedure. If we do not reach you, we will leave a message.     If any biopsies were taken you will be contacted by phone or by letter within the next 1-3 weeks.  Please call us  at [phone] if you have not heard  about the biopsies in 3 weeks.    SIGNATURES/CONFIDENTIALITY: You and/or your care partner have signed paperwork which will be entered into your electronic medical record.  These signatures attest to the fact that that the information above on your After Visit Summary has been reviewed and is understood.  Full responsibility of the confidentiality of this discharge information lies with you and/or your care-partner.

## 2024-06-18 NOTE — Op Note (Signed)
 [facility] Patient Name: Trevor Gray Procedure Date: 06/18/2024 3:20 PM MRN: [id number] Endoscopist: Lupita FORBES Commander , MD, [id number] Age: 58 Referring MD:  Date of Birth: [birth year] Gender: Male Account #: [id number] Procedure:                Colonoscopy Indications:              Surveillance: Personal history of adenomatous                            polyps on last colonoscopy 5 years ago, Last                            colonoscopy: 2020 Medicines:                Monitored Anesthesia Care Procedure:                Pre-Anesthesia Assessment:                           - Prior to the procedure, a History and Physical                            was performed, and patient medications and                            allergies were reviewed. The patient's tolerance of                            previous anesthesia was also reviewed. The risks                            and benefits of the procedure and the sedation                            options and risks were discussed with the patient.                            All questions were answered, and informed consent                            was obtained. Prior Anticoagulants: The patient has                            taken no anticoagulant or antiplatelet agents. ASA                            Grade Assessment: II - A patient with mild systemic                            disease. After reviewing the risks and benefits,                            the patient was deemed in satisfactory condition to  undergo the procedure.                           After obtaining informed consent, the colonoscope                            was passed under direct vision. Throughout the                            procedure, the patient's blood pressure, pulse, and                            oxygen saturations were monitored continuously. The                            Olympus CF-[id number] ([id number]) Colonoscope was                             introduced through the anus and advanced to the the                            cecum, identified by appendiceal orifice and                            ileocecal valve. The colonoscopy was performed                            without difficulty. The patient tolerated the                            procedure well. The quality of the bowel                            preparation was good. The appendiceal orifice and                            the rectum were photographed. The bowel preparation                            used was SUPREP via split dose instruction. Scope In: 3:43:23 PM Scope Out: 4:04:05 PM Scope Withdrawal Time: 0 hours 14 minutes 38 seconds  Total Procedure Duration: 0 hours 20 minutes 42 seconds  Findings:                 The perianal and digital rectal examinations were                            normal. Pertinent negatives include normal prostate                            (size, shape, and consistency).                           A 10 mm polyp was found in the ascending colon. The  polyp was flat. The polyp was removed with a cold                            snare. Resection and retrieval were complete.                            Verification of patient identification for the                            specimen was done. Estimated blood loss was minimal.                           A 10 mm polyp was found in the descending colon.                            The polyp was flat. The polyp was removed with a                            cold snare. Resection and retrieval were complete.                            Verification of patient identification for the                            specimen was done. Estimated blood loss was minimal.                           An 8 mm polyp was found in the sigmoid colon. The                            polyp was sessile. The polyp was removed with a                            cold snare. Resection  and retrieval were complete.                            Verification of patient identification for the                            specimen was done. Estimated blood loss was minimal.                           Multiple diverticula were found in the sigmoid                            colon.                           The exam was otherwise without abnormality on                            direct and retroflexion views. Complications:            No  immediate complications. Estimated Blood Loss:     Estimated blood loss was minimal. Impression:               - One 10 mm polyp in the ascending colon, removed                            with a cold snare. Resected and retrieved.                           - One 10 mm polyp in the descending colon, removed                            with a cold snare. Resected and retrieved.                           - One 8 mm polyp in the sigmoid colon, removed with                            a cold snare. Resected and retrieved.                           - Diverticulosis in the sigmoid colon.                           - The examination was otherwise normal on direct                            and retroflexion views.                           - Personal history of colonic polyps. 3 diminutive                            adenomas removed 2020. Recommendation:           - Patient has a contact number available for                            emergencies. The signs and symptoms of potential                            delayed complications were discussed with the                            patient. Return to normal activities tomorrow.                            Written discharge instructions were provided to the                            patient.                           - Clear liquids x 1 hour then soft foods rest of  day. Start prior diet tomorrow.                           - Continue present medications.                           -  Repeat colonoscopy is recommended for                            surveillance. The colonoscopy date will be                            determined after pathology results from today's                            exam become available for review. Lupita FORBES Commander, MD 06/18/2024 4:17:22 PM This report has been signed electronically.

## 2024-06-18 NOTE — Progress Notes (Signed)
 Transferred to PACU via stretcher.  Not responding to stimulation at this time.  VSS upon leaving procedure room.

## 2024-06-18 NOTE — Op Note (Addendum)
 [facility] Patient Name: Trevor Gray Procedure Date: 06/18/2024 3:25 PM MRN: [id number] Endoscopist: Lupita FORBES Commander , MD, [id number] Age: 58 Referring MD:  Date of Birth: 1966/01/16 Gender: Male Account #: [id number] Procedure:                Upper GI endoscopy Indications:              Dysphagia Medicines:                Monitored Anesthesia Care Procedure:                Pre-Anesthesia Assessment:                           - Prior to the procedure, a History and Physical                            was performed, and patient medications and                            allergies were reviewed. The patient's tolerance of                            previous anesthesia was also reviewed. The risks                            and benefits of the procedure and the sedation                            options and risks were discussed with the patient.                            All questions were answered, and informed consent                            was obtained. Prior Anticoagulants: The patient has                            taken no anticoagulant or antiplatelet agents. ASA                            Grade Assessment: II - A patient with mild systemic                            disease. After reviewing the risks and benefits,                            the patient was deemed in satisfactory condition to                            undergo the procedure.                           After obtaining informed consent, the endoscope was  passed under direct vision. Throughout the                            procedure, the patient's blood pressure, pulse, and                            oxygen saturations were monitored continuously. The                            Olympus Scope [id number] was introduced through the                            mouth, and advanced to the second part of duodenum.                            The upper GI endoscopy was  accomplished without                            difficulty. The patient tolerated the procedure                            well. Scope In: Scope Out: Findings:                 No endoscopic abnormality was evident in the                            esophagus to explain the patient's complaint of                            dysphagia. It was decided, however, to proceed with                            dilation of the entire esophagus. The scope was                            withdrawn. Dilation was performed with a Maloney                            dilator with mild resistance at 54 Fr. The dilation                            site was examined following endoscope reinsertion                            and showed no change. Estimated blood loss: none.                           A 1 cm hiatal hernia was present.                           The cardia and gastric fundus were otherwise normal  on retroflexion.                           The gastroesophageal flap valve was visualized                            endoscopically and classified as Hill Grade II                            (fold present, opens with respiration).                           The exam was otherwise without abnormality. Complications:            No immediate complications. Estimated Blood Loss:     Estimated blood loss: none. Impression:               - No endoscopic esophageal abnormality to explain                            patient's dysphagia. Esophagus dilated. Dilated.                           - 1 cm hiatal hernia.                           - Gastroesophageal flap valve classified as Hill                            Grade II (fold present, opens with respiration).                           - The examination was otherwise normal.                           - No specimens collected. Recommendation:           - Patient has a contact number available for                            emergencies. The  signs and symptoms of potential                            delayed complications were discussed with the                            patient. Return to normal activities tomorrow.                            Written discharge instructions were provided to the                            patient.                           - Clear liquids x 1 hour then soft foods rest of  day. Start prior diet tomorrow.                           - Continue present medications.                           - See the other procedure note for documentation of                            additional recommendations. Lupita FORBES Commander, MD 06/18/2024 4:10:38 PM This report has been signed electronically.

## 2024-06-18 NOTE — Progress Notes (Unsigned)
 [facility] History and Physical   Primary Care Physician:  Patient, No Pcp Per   Reason for Procedure:    Encounter Diagnoses  Name Primary?   Hx of adenomatous colonic polyps Yes   Esophageal dysphagia      Plan:    EGD, possible esophageal dilation and a colonoscopy.   The patient was provided an opportunity to ask questions and all were answered. The patient agreed with the plan.   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  He is maintained on PPI.  Also has history of adenomatous colon polyps.  3 diminutive polyps removed at colonoscopy in 2020.  Also has sigmoid diverticulosis.   Past Medical History:  Diagnosis Date   Esophageal candidiasis (HCC)    GERD (gastroesophageal reflux disease)    Hx of adenomatous colonic polyps 05/24/2019   Pancreatitis    Pneumonia    PPD positive     Past Surgical History:  Procedure Laterality Date   ESOPHAGOGASTRODUODENOSCOPY     HERNIA REPAIR     TONSILLECTOMY       Current Outpatient Medications  Medication Sig Dispense Refill   Esomeprazole Magnesium (NEXIUM PO)      traMADol  (ULTRAM ) 50 MG tablet Take 1 tablet (50 mg total) by mouth every 6 (six) hours as needed. (Patient not taking: No sig reported) 20 tablet 0   Current Facility-Administered Medications  Medication Dose Route Frequency Provider Last Rate Last Admin   0.9 %  sodium chloride  infusion  500 mL Intravenous Continuous Avram Lupita BRAVO, MD        Allergies as of 06/18/2024   (No Known Allergies)    Family History  Problem Relation Age of Onset   Colon cancer Neg Hx    Colon polyps Neg Hx    Esophageal cancer Neg Hx    Rectal cancer Neg Hx    Stomach cancer Neg Hx     Social History   Socioeconomic History   Marital status: Married    Spouse name: Not on file   Number of children: 4   Years of  education: Not on file   Highest education level: Not on file  Occupational History    Employer: [employer]  Tobacco Use   Smoking status: Never   Smokeless tobacco: Never  Vaping Use   Vaping status: Never Used  Substance and Sexual Activity   Alcohol use: Yes    Alcohol/week: 0.0 standard drinks of alcohol    Comment: 1-2 beers per month per pt   Drug use: Not Currently   Sexual activity: Not Currently  Other Topics Concern   Not on file  Social History Narrative   Married, MD - cardiologist   Wife MD   4 sons   Avid runner   Social Drivers of Corporate Investment Banker Strain: Not on file  Food Insecurity: Not on file  Transportation Needs: Not on file  Physical Activity: Not on file  Stress: Not on file  Social Connections: Not on file  Intimate Partner Violence: Not on file    Review of Systems: Positive for *** All other review of systems negative except as mentioned in the HPI.  Physical Exam: Vital signs BP (!) 91/58   Pulse (!) 44   Temp (!) 97.4 F (36.3 C) (Temporal)   Ht 6' 1 (1.854 m)   Wt  150 lb (68 kg)   SpO2 99%   BMI 19.79 kg/m   General:   Alert,  Well-developed, well-nourished, pleasant and cooperative in NAD Lungs:  Clear throughout to auscultation.   Heart:  Regular rate and rhythm; no murmurs, clicks, rubs,  or gallops. Abdomen:  Soft, nontender and nondistended. Normal bowel sounds.   Neuro/Psych:  Alert and cooperative. Normal mood and affect. A and O x 3   @[place] [facility]  CHARLENA Commander, MD, [facility] [phone] (pager) 06/18/2024 3:21 PM@

## 2024-06-19 ENCOUNTER — Telehealth: Payer: Self-pay

## 2024-06-19 NOTE — Telephone Encounter (Signed)
 No answer, left message to call if having any issues or concerns, B.Vester Titsworth RN

## 2024-06-21 LAB — SURGICAL PATHOLOGY

## 2024-06-25 ENCOUNTER — Ambulatory Visit: Payer: Self-pay | Admitting: Internal Medicine

## 2024-06-25 DIAGNOSIS — Z860101 Personal history of adenomatous and serrated colon polyps: Secondary | ICD-10-CM
